# Patient Record
Sex: Female | Born: 1935 | Race: White | Hispanic: No | Marital: Single | Smoking: Never smoker
Health system: Southern US, Community
[De-identification: ages and names within clinical notes are randomized; demographics above are authoritative.]

## PROBLEM LIST (undated history)

## (undated) DIAGNOSIS — G4700 Insomnia, unspecified: Secondary | ICD-10-CM

## (undated) DIAGNOSIS — R270 Ataxia, unspecified: Secondary | ICD-10-CM

## (undated) DIAGNOSIS — R739 Hyperglycemia, unspecified: Secondary | ICD-10-CM

## (undated) DIAGNOSIS — J45909 Unspecified asthma, uncomplicated: Secondary | ICD-10-CM

## (undated) DIAGNOSIS — F329 Major depressive disorder, single episode, unspecified: Secondary | ICD-10-CM

## (undated) DIAGNOSIS — IMO0001 Reserved for inherently not codable concepts without codable children: Secondary | ICD-10-CM

## (undated) DIAGNOSIS — M810 Age-related osteoporosis without current pathological fracture: Secondary | ICD-10-CM

## (undated) DIAGNOSIS — K219 Gastro-esophageal reflux disease without esophagitis: Secondary | ICD-10-CM

## (undated) DIAGNOSIS — J019 Acute sinusitis, unspecified: Secondary | ICD-10-CM

## (undated) DIAGNOSIS — F32A Depression, unspecified: Secondary | ICD-10-CM

## (undated) DIAGNOSIS — M1990 Unspecified osteoarthritis, unspecified site: Secondary | ICD-10-CM

## (undated) DIAGNOSIS — K589 Irritable bowel syndrome without diarrhea: Secondary | ICD-10-CM

## (undated) DIAGNOSIS — R32 Unspecified urinary incontinence: Secondary | ICD-10-CM

## (undated) DIAGNOSIS — J449 Chronic obstructive pulmonary disease, unspecified: Secondary | ICD-10-CM

## (undated) DIAGNOSIS — L309 Dermatitis, unspecified: Secondary | ICD-10-CM

## (undated) DIAGNOSIS — J209 Acute bronchitis, unspecified: Secondary | ICD-10-CM

## (undated) DIAGNOSIS — R079 Chest pain, unspecified: Secondary | ICD-10-CM

## (undated) DIAGNOSIS — R809 Proteinuria, unspecified: Secondary | ICD-10-CM

## (undated) DIAGNOSIS — F429 Obsessive-compulsive disorder, unspecified: Secondary | ICD-10-CM

## (undated) DIAGNOSIS — I10 Essential (primary) hypertension: Secondary | ICD-10-CM

## (undated) DIAGNOSIS — M8580 Other specified disorders of bone density and structure, unspecified site: Secondary | ICD-10-CM

## (undated) DIAGNOSIS — E7800 Pure hypercholesterolemia, unspecified: Secondary | ICD-10-CM

## (undated) DIAGNOSIS — L0390 Cellulitis, unspecified: Secondary | ICD-10-CM

## (undated) HISTORY — DX: Gastro-esophageal reflux disease without esophagitis: K21.9

## (undated) HISTORY — DX: Acute sinusitis, unspecified: J01.90

## (undated) HISTORY — DX: Reserved for inherently not codable concepts without codable children: IMO0001

## (undated) HISTORY — DX: Ataxia, unspecified: R27.0

## (undated) HISTORY — DX: Unspecified urinary incontinence: R32

## (undated) HISTORY — DX: Age-related osteoporosis without current pathological fracture: M81.0

## (undated) HISTORY — DX: Acute bronchitis, unspecified: J20.9

## (undated) HISTORY — DX: Dermatitis, unspecified: L30.9

## (undated) HISTORY — DX: Obsessive-compulsive disorder, unspecified: F42.9

## (undated) HISTORY — DX: Insomnia, unspecified: G47.00

## (undated) HISTORY — DX: Irritable bowel syndrome, unspecified: K58.9

## (undated) HISTORY — DX: Proteinuria, unspecified: R80.9

## (undated) HISTORY — PX: HERNIA REPAIR: SHX51

## (undated) HISTORY — DX: Chest pain, unspecified: R07.9

## (undated) HISTORY — DX: Essential (primary) hypertension: I10

## (undated) HISTORY — DX: Unspecified asthma, uncomplicated: J45.909

## (undated) HISTORY — DX: Chronic obstructive pulmonary disease, unspecified: J44.9

## (undated) HISTORY — PX: CATARACT EXTRACTION: SUR2

## (undated) HISTORY — DX: Depression, unspecified: F32.A

## (undated) HISTORY — DX: Unspecified osteoarthritis, unspecified site: M19.90

## (undated) HISTORY — DX: Major depressive disorder, single episode, unspecified: F32.9

## (undated) HISTORY — DX: Cellulitis, unspecified: L03.90

## (undated) HISTORY — PX: BLADDER SURGERY: SHX569

## (undated) HISTORY — DX: Other specified disorders of bone density and structure, unspecified site: M85.80

## (undated) HISTORY — DX: Pure hypercholesterolemia, unspecified: E78.00

## (undated) HISTORY — DX: Hyperglycemia, unspecified: R73.9

## (undated) HISTORY — PX: CATARACT EXTRACTION W/PHACO: SHX586

---

## 2012-02-20 LAB — PULMONARY FUNCTION TEST

## 2012-05-28 LAB — PULMONARY FUNCTION TEST

## 2012-07-23 ENCOUNTER — Encounter: Payer: Self-pay | Admitting: Pulmonary Disease

## 2012-07-24 ENCOUNTER — Ambulatory Visit (INDEPENDENT_AMBULATORY_CARE_PROVIDER_SITE_OTHER): Payer: Medicaid Other | Admitting: Pulmonary Disease

## 2012-07-24 ENCOUNTER — Encounter: Payer: Self-pay | Admitting: Pulmonary Disease

## 2012-07-24 VITALS — BP 114/58 | HR 64 | Temp 97.5°F | Ht 61.5 in | Wt 139.0 lb

## 2012-07-24 DIAGNOSIS — R05 Cough: Secondary | ICD-10-CM

## 2012-07-24 DIAGNOSIS — R059 Cough, unspecified: Secondary | ICD-10-CM

## 2012-07-24 DIAGNOSIS — J449 Chronic obstructive pulmonary disease, unspecified: Secondary | ICD-10-CM

## 2012-07-24 NOTE — Progress Notes (Signed)
Subjective:    Patient ID: Alexandria Morris, female    DOB: July 11, 1936, 76 y.o.   MRN: [id number]  HPI 76 y.o never smoker presents for second opinion on 'copd.' She was initially told that she had asthma, then on another evaluation in [place], [place], she was told that she had 'COPD' & after coming back to [facility], Dr Tomasita Morrow did breathing tests & CXRs told her that she has COPD. She is confused about her diagnosis & wants a second opinion. She reports cough every now and then. Denies sob, chest pain, chest tightness, and wheezing.  She walks about a mile daily which includes an incline. She used to have attacks of bronchitis which have now resolved. She denies childhood history of asthma.She has a deviated nasal septum. Spirometry showed FEV1 of 0.57 (31%) & FVC of 1.26 (51%) c/w severe airway obstruction. She is on Seroquel , klonopin and Effexor from [facility]. Reflux is controlled on omeprazole. Med review shows ARB    Past Medical History  Diagnosis Date  . Acute bronchitis   . Acute sinusitis   . Cellulitis   . Chest pain   . Depression   . Hypercholesteremia   . Hyperglycemia   . Hypertension   . Microalbuminuria   . Osteoporosis   . Asthma   . Ataxia   . COPD (chronic obstructive pulmonary disease)   . Dermatitis   . Reflux   . HTN (hypertension)   . Insomnia   . IBS (irritable bowel syndrome)   . OCD (obsessive compulsive disorder)   . Osteoarthritis   . Osteopenia   . Incontinence of urine     Past Surgical History  Procedure Date  . Bladder surgery   . Cataract extraction   . Hernia repair     Allergies  Allergen Reactions  . Albuterol   . Bentyl (Dicyclomine Hcl)   . Claritin (Loratadine)   . Dicyclomine   . Haldol Decanoate (Haloperidol Decanoate)   . Lovaza (Omega-3-Acid Ethyl Esters)   . Pravastatin   . Simvastatin   . Triavil (Perphenazine-Amitriptyline)   . Zyrtec (Cetirizine Hcl)     History   Social History  . Marital  Status: Single    Spouse Name: N/A    Number of Children: 1  . Years of Education: N/A   Occupational History  . retired     Engineer, manufacturing systems, Engineer, structural, [employer]   Social History Main Topics  . Smoking status: Never Smoker   . Smokeless tobacco: Never Used  . Alcohol Use: No  . Drug Use: No  . Sexually Active: Not on file   Other Topics Concern  . Not on file   Social History Narrative  . No narrative on file    Review of Systems  Constitutional: Positive for appetite change. Negative for fever and unexpected weight change.  HENT: Positive for congestion, rhinorrhea, sneezing and postnasal drip. Negative for ear pain, nosebleeds, sore throat, trouble swallowing, dental problem and sinus pressure.   Eyes: Negative for redness and itching.  Respiratory: Positive for cough. Negative for chest tightness, shortness of breath and wheezing.   Cardiovascular: Negative for chest pain, palpitations and leg swelling.  Gastrointestinal: Positive for vomiting. Negative for nausea.  Genitourinary: Negative for dysuria.  Musculoskeletal: Negative for joint swelling.  Skin: Negative for rash.  Neurological: Negative for headaches.  Hematological: Does not bruise/bleed easily.  Psychiatric/Behavioral: Negative for dysphoric mood. The patient is nervous/anxious.        Objective:  Physical Exam  Gen. Pleasant, well-nourished, in no distress, normal affect ENT - no lesions, no post nasal drip Neck: No JVD, no thyromegaly, no carotid bruits Lungs: no use of accessory muscles, no dullness to percussion, clear without rales or rhonchi  Cardiovascular: Rhythm regular, heart sounds  normal, no murmurs or gallops, no peripheral edema Abdomen: soft and non-tender, no hepatosplenomegaly, BS normal. Musculoskeletal: No deformities, no cyanosis or clubbing Neuro:  alert, non focal        Assessment & Plan:

## 2012-07-24 NOTE — Patient Instructions (Signed)
Obtain records (PFTs, XRays from [facility] - dr Tomasita Morrow Breathing test does show airway obstruction Use symbicort if you are short of breath

## 2012-07-24 NOTE — Assessment & Plan Note (Signed)
Obstruction appears to be chronic. I could not test for reversibility today. She does not have childhood history of asthma. This may very well represent fixed obstruction. There did not appear to be any triggers in an environment or on her medications and history does not suggest asthma. We will treat her as COPD for now although in the absence of smoking this does not represent classic COPD. She can stay on Symbicort. We discussed signs of symptoms of an exacerbation such as green phlegm, wheezing and increased shortness of breath and she will contact us immediately if this should arise. We will obtain her records from Dr. Tomasita Morrow in the meantime. If this has not been done, we'll perform post bronchodilator testing. She does not appear to be very symptomatic, otherwise pulmonary rehabilitation can also be an option.

## 2012-07-27 ENCOUNTER — Telehealth: Payer: Self-pay | Admitting: Pulmonary Disease

## 2012-07-27 NOTE — Telephone Encounter (Signed)
Reviewed PFTs from [facility] >> last 6/13 severe airway obstruction, POS BD response. Stay on symbicort 2 puffs bid

## 2012-07-27 NOTE — Telephone Encounter (Signed)
lmomtcb x1 for pt 

## 2012-07-30 NOTE — Telephone Encounter (Signed)
I spoke with patient about results and she verbalized understanding and had no questions 

## 2012-07-30 NOTE — Telephone Encounter (Signed)
Pt return call. Please cb at [phone]

## 2012-08-21 ENCOUNTER — Ambulatory Visit (INDEPENDENT_AMBULATORY_CARE_PROVIDER_SITE_OTHER): Payer: 59 | Admitting: Pulmonary Disease

## 2012-08-21 ENCOUNTER — Telehealth: Payer: Self-pay | Admitting: Pulmonary Disease

## 2012-08-21 ENCOUNTER — Ambulatory Visit (HOSPITAL_BASED_OUTPATIENT_CLINIC_OR_DEPARTMENT_OTHER)
Admission: RE | Admit: 2012-08-21 | Discharge: 2012-08-21 | Disposition: A | Discharge status: Home / Self Care | Payer: PRIVATE HEALTH INSURANCE | Source: Ambulatory Visit | Attending: Pulmonary Disease | Admitting: Pulmonary Disease

## 2012-08-21 ENCOUNTER — Encounter: Payer: Self-pay | Admitting: Pulmonary Disease

## 2012-08-21 VITALS — BP 128/66 | HR 74 | Temp 97.5°F | Ht 61.5 in | Wt 144.5 lb

## 2012-08-21 DIAGNOSIS — J449 Chronic obstructive pulmonary disease, unspecified: Secondary | ICD-10-CM

## 2012-08-21 DIAGNOSIS — R911 Solitary pulmonary nodule: Secondary | ICD-10-CM

## 2012-08-21 DIAGNOSIS — I517 Cardiomegaly: Secondary | ICD-10-CM | POA: Insufficient documentation

## 2012-08-21 DIAGNOSIS — J4489 Other specified chronic obstructive pulmonary disease: Secondary | ICD-10-CM

## 2012-08-21 NOTE — Progress Notes (Signed)
  Subjective:    Patient ID: Alexandria Morris, female    DOB: [birth year], 76 y.o.   MRN: [id number]  HPI  75 y.o never smoker presents for second opinion on 'copd.'  She was initially told that she had asthma, then on another evaluation in [place], [place], she was told that she had 'COPD' & after coming back to [facility], Dr Tomasita Morrow did breathing tests & CXRs told her that she has COPD. She is confused about her diagnosis & wants a second opinion.  She reports cough every now and then. Denies sob, chest pain, chest tightness, and wheezing.  She walks about a mile daily which includes an incline. She used to have attacks of bronchitis which have now resolved. She denies childhood history of asthma.She has a deviated nasal septum.  Spirometry showed FEV1 of 0.57 (31%) & FVC of 1.26 (51%) c/w severe airway obstruction.  She is on Seroquel , klonopin and Effexor [facility] for OCD & depression Reflux is controlled on omeprazole.  Med review shows ARB Reviewed PFTs from [facility] >> last 6/13 severe airway obstruction, POS BD response.    08/21/2012 Rented a House x 27 y - had mold, now moveed out x 2 y Father smoked CAT score 4 - denies dyspnea or cough Last chest cold was 5/13 - infrequent , maybe once/2y, vaccines uptodate  Review of Systems neg for any significant sore throat, dysphagia, itching, sneezing, nasal congestion or excess/ purulent secretions, fever, chills, sweats, unintended wt loss, pleuritic or exertional cp, hempoptysis, orthopnea pnd or change in chronic leg swelling. Also denies presyncope, palpitations, heartburn, abdominal pain, nausea, vomiting, diarrhea or change in bowel or urinary habits, dysuria,hematuria, rash, arthralgias, visual complaints, headache, numbness weakness or ataxia.     Objective:   Physical Exam  Gen. Pleasant, well-nourished, in no distress ENT - no lesions, no post nasal drip Neck: No JVD, no thyromegaly, no carotid bruits Lungs: no  use of accessory muscles, no dullness to percussion, clear without rales or rhonchi  Cardiovascular: Rhythm regular, heart sounds  normal, no murmurs or gallops, no peripheral edema Musculoskeletal: No deformities, no cyanosis or clubbing        Assessment & Plan:

## 2012-08-21 NOTE — Telephone Encounter (Signed)
Notes Recorded by Oretha Milch, MD on 08/21/2012 at 4:24 PM Small nodule noted on CXR - need CT scan of chest - no contrast for further clarification OK to order if pt willing      I spoke with patient about results and she verbalized understanding and had no questions. She okay to have ct chest. i have placed orded and aware she will be contacted once this is set up. Nothing further was needed

## 2012-08-21 NOTE — Patient Instructions (Addendum)
Chest xray today You have obstruction in your airways Take symbicort 2 puffs daily Call us if you ever get a chest cold Take flu shot when available

## 2012-08-21 NOTE — Assessment & Plan Note (Signed)
Never smoker, severe airway obstruction ? Fixed asthma  Chest xray today You have obstruction in your airways Take symbicort 2 puffs daily Call us if you ever get a chest cold Take flu shot when available May benefit from pulm rehab if symptoms get worse

## 2012-08-24 ENCOUNTER — Telehealth: Payer: Self-pay | Admitting: Pulmonary Disease

## 2012-08-24 ENCOUNTER — Ambulatory Visit (INDEPENDENT_AMBULATORY_CARE_PROVIDER_SITE_OTHER)
Admission: RE | Admit: 2012-08-24 | Discharge: 2012-08-24 | Disposition: A | Discharge status: Home / Self Care | Payer: 59 | Source: Ambulatory Visit | Attending: Pulmonary Disease | Admitting: Pulmonary Disease

## 2012-08-24 DIAGNOSIS — R911 Solitary pulmonary nodule: Secondary | ICD-10-CM

## 2012-08-24 NOTE — Telephone Encounter (Signed)
Called, spoke with pt.  Requesting CT resultsfrom CT done today.  Dr. Vassie Loll, pls advise.  Thank you.

## 2012-08-24 NOTE — Telephone Encounter (Signed)
Small bilateral pulmonary nodules .A repeat noncontrast chest CT in 6 months is suggested.  Moderate sized hiatal hernia.  Common bile duct dilatation  - suggest chk blood work (BMET, LFTs) with [place] or with PCP

## 2012-08-24 NOTE — Telephone Encounter (Signed)
Called and spoke with pt and she is aware of lab results per RA.  Pt will think about when she wants to have her labs checked.  She has appt with her PCP in October and is not sure that she wants to wait that long.  Pt will call back once she decides.  Will sign off of this message for now.

## 2012-08-27 NOTE — Telephone Encounter (Signed)
RA, please advise ct results, thanks!!

## 2012-08-27 NOTE — Telephone Encounter (Signed)
Pl note earlier phone note

## 2012-08-28 ENCOUNTER — Telehealth: Payer: Self-pay | Admitting: Pulmonary Disease

## 2012-08-28 NOTE — Telephone Encounter (Signed)
I spoke with pt and advised I will send her CT results over to Dr. Willa Rough. I have just done so. Nothing further was needed

## 2012-08-29 ENCOUNTER — Telehealth: Payer: Self-pay | Admitting: Pulmonary Disease

## 2012-08-29 NOTE — Telephone Encounter (Signed)
Pt has already been giving results. See phone 08/24/12.

## 2012-08-29 NOTE — Telephone Encounter (Signed)
Pt aware that Ct has been sent to Dr Willa Rough again and if any troubles with fax to let us know.

## 2012-08-29 NOTE — Telephone Encounter (Signed)
Mindy, please have RA review and advise on CT prior to sending to Dr Willa Rough. This way the patient knows results as well.

## 2012-08-30 ENCOUNTER — Telehealth: Payer: Self-pay | Admitting: Pulmonary Disease

## 2012-08-30 NOTE — Telephone Encounter (Signed)
Pl look at earlier phone notes This has been done already

## 2012-08-30 NOTE — Telephone Encounter (Signed)
RA, please advise on ct chest results thanks

## 2012-08-30 NOTE — Telephone Encounter (Signed)
Spoke with pt and notified of results per RA. She verbalized understanding and states nothing further needed.

## 2012-09-20 ENCOUNTER — Telehealth: Payer: Self-pay | Admitting: Pulmonary Disease

## 2012-09-20 ENCOUNTER — Encounter: Payer: Self-pay | Admitting: *Deleted

## 2012-09-20 NOTE — Telephone Encounter (Signed)
Alexandria Morris, have you seen any recs on this pt? Pls advise.

## 2012-09-21 NOTE — Telephone Encounter (Signed)
Called # provided above - went directly to VM - lmomtcb 

## 2012-09-21 NOTE — Telephone Encounter (Signed)
Pt states she signed a release when here for OV on 08/21/12 and wants this refaxed since we have not received records. She will also call Dr. Orma Flaming office to see if they have sent them. Will forward back to Mindy so she can refax the release.

## 2012-09-21 NOTE — Telephone Encounter (Signed)
Patient returning call.

## 2012-09-21 NOTE — Telephone Encounter (Signed)
Dr. Vassie Loll records are in pt chart under the media tab. Pt wants to know if RA has everything he needed from [facility]. Please advise Dr. Vassie Loll thanks

## 2012-09-21 NOTE — Telephone Encounter (Signed)
Nope have not seen anything on this pt and looked in RA look at. thanks

## 2012-10-04 NOTE — Telephone Encounter (Signed)
Pt is aware of this and needed nothing further 

## 2012-10-04 NOTE — Telephone Encounter (Signed)
Has been reviewed before sending to scan - nothing more needed

## 2012-10-29 ENCOUNTER — Telehealth: Payer: Self-pay | Admitting: Pulmonary Disease

## 2012-10-29 MED ORDER — NYSTATIN 100000 UNIT/ML MT SUSP: 500000.0000 [IU] | Freq: Two times a day (BID) | OROMUCOSAL | Status: DC

## 2012-10-29 NOTE — Telephone Encounter (Signed)
Pt is aware of Ra recs. Nystatin sent to her pharmacy. Pt wishes to remain on the Symbicort for now and will call if she changes her mind and wants to try the Advair.

## 2012-10-29 NOTE — Telephone Encounter (Signed)
Called, spoke with pt.  Believes symbicort is causing thrush.  Reports she has white patches on tongue, her food doesn't taste right, and has an "awful taste in my mouth."  Denies trouble swallowing.  States she is rinsing mouth out with water really well after using symbicort.  Advised she could try a baking soda based toothpaste after using inhaler.  She verbalized understanding and would like to know: 1.  She has nystatin mouthwash -- could she use this to clear the thrush up or does she need something else. 2.  Is there another inhaler she could use that would work for her but not cause thrush?    Dr. Vassie Loll, pls advise.  Thank you.  Allergies  Allergen Reactions  . Albuterol   . Bentyl (Dicyclomine Hcl)   . Claritin (Loratadine)   . Dicyclomine   . Haldol Decanoate (Haloperidol Decanoate)   . Lovaza (Omega-3-Acid Ethyl Esters)   . Pravastatin   . Simvastatin   . Triavil (Perphenazine-Amitriptyline)   . Zyrtec (Cetirizine Hcl)

## 2012-10-29 NOTE — Telephone Encounter (Signed)
Nystatin ok - 5ml po bid swish & swallow Alternative - any steroid inhaler will have thrush as side effect, can try advair 100/50 bid - dry powder , maybe will have less thrush - rinse mouth after use

## 2012-12-16 IMAGING — CR DG CHEST 2V
2 series · 2 of 2 positions shown · non-contrast
Comparison: None.

CLINICAL DATA: COPD

CHEST - 2 VIEW

[w chest pa]
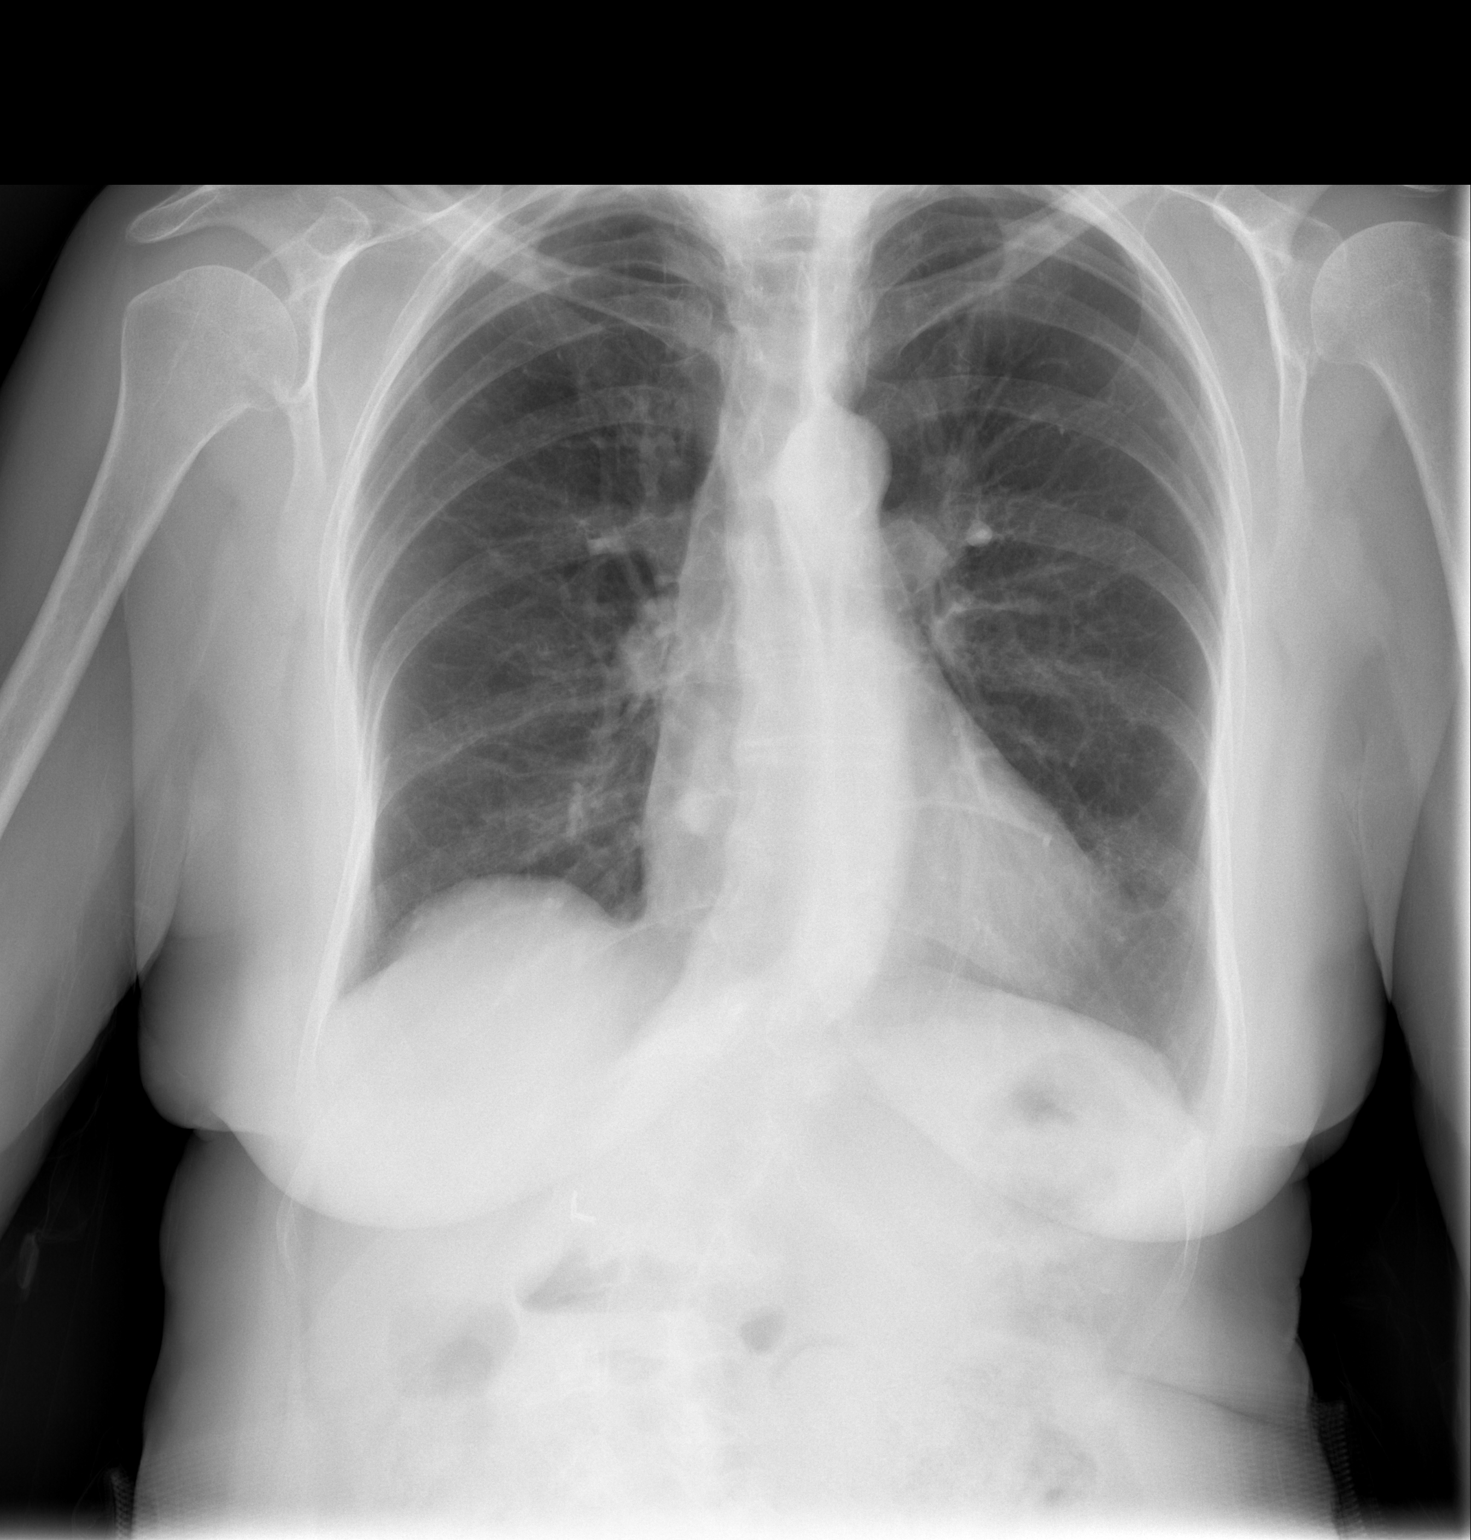

[w chest lat]
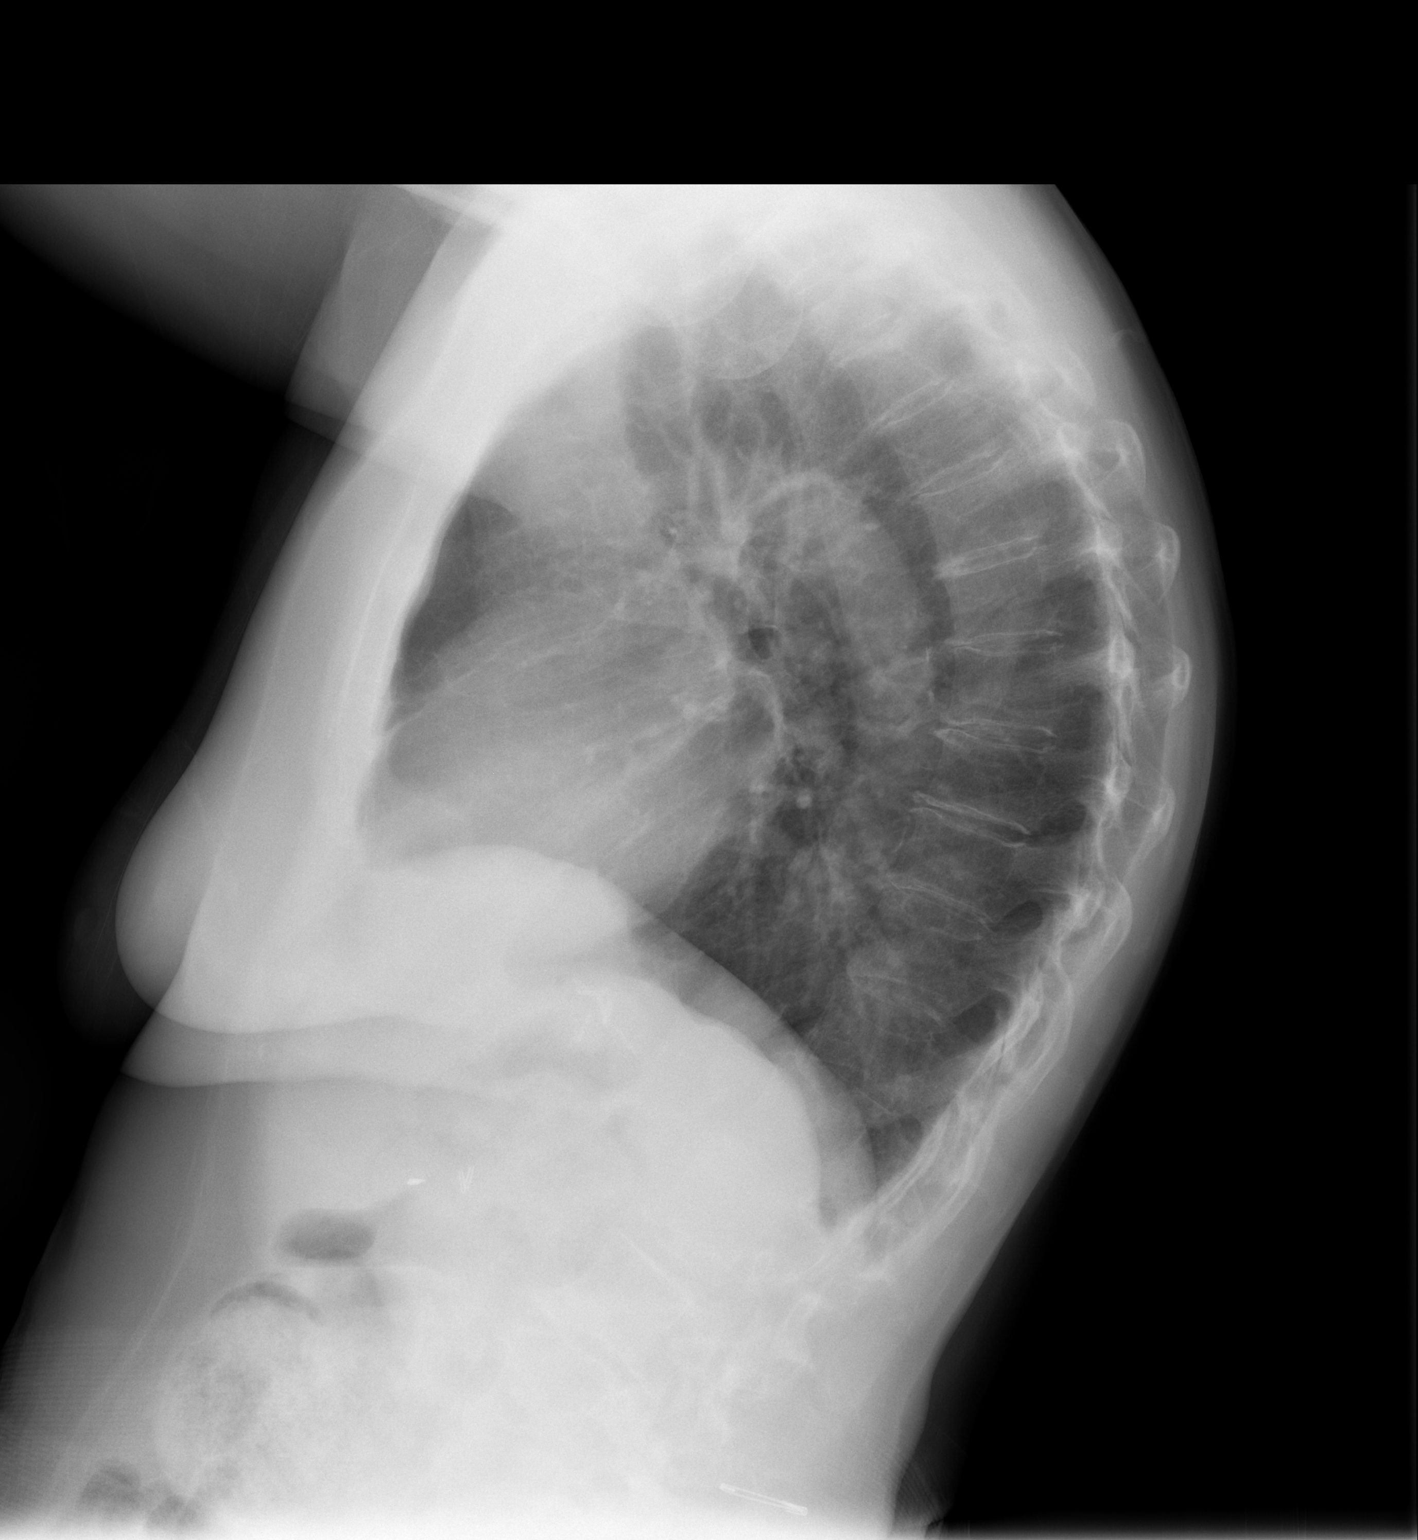

[2 of 2 positions shown; findings below may reference images not displayed]

FINDINGS: Mild cardiomegaly.  Hyperaeration with increased AP
diameter of the chest. Focal nodular density projects over the
right atrium on the PA view only.  No pleural effusion.  No
pneumothorax.
IMPRESSION: Right basilar nodular density.  Pulmonary nodule is not excluded.
Comparison with prior studies would be helpful.  If none are
available, CT is recommended.

## 2013-01-15 ENCOUNTER — Encounter: Payer: Self-pay | Admitting: Adult Health

## 2013-01-15 ENCOUNTER — Ambulatory Visit (INDEPENDENT_AMBULATORY_CARE_PROVIDER_SITE_OTHER): Payer: PRIVATE HEALTH INSURANCE | Admitting: Adult Health

## 2013-01-15 VITALS — BP 102/72 | HR 70 | Temp 97.6°F | Ht 61.5 in | Wt 151.0 lb

## 2013-01-15 DIAGNOSIS — J449 Chronic obstructive pulmonary disease, unspecified: Secondary | ICD-10-CM

## 2013-01-15 DIAGNOSIS — R918 Other nonspecific abnormal finding of lung field: Secondary | ICD-10-CM

## 2013-01-15 NOTE — Addendum Note (Signed)
Addended by: Darrell Jewel on: 01/15/2013 10:33 AM   Modules accepted: Orders

## 2013-01-15 NOTE — Patient Instructions (Signed)
Continue on Symbicort 2 puffs Twice daily  -brush/rinse/gargle after use.  May Use ProAir HFA 2 puffs every 6 hr As needed  Wheezing--THIS IS YOUR RESCUE/EMERGENCY INHALER We are setting you up for a CT chest in March to follow very small lung nodules.  Check with your Family Doctor when your last Pneumonia vaccine was??  Follow up Dr. Vassie Loll  In 4 months and As needed

## 2013-01-15 NOTE — Assessment & Plan Note (Signed)
Chronic obstructive asthma compensated on present regimen No recent flares.  Cont on current regimen follow up with Dr. Vassie Loll  In 4 months  Check on Pneumovax  w/ PCP

## 2013-01-15 NOTE — Progress Notes (Signed)
Subjective:    Patient ID: Alexandria Morris, female    DOB: 09/25/1936, 77 y.o.   MRN: [id number]  HPI 77 y.o never smoker presents for second opinion on 'copd.'  She was initially told that she had asthma, then on another evaluation in [place], [place], she was told that she had 'COPD' & after coming back to [facility], Dr Tomasita Morrow did breathing tests & CXRs told her that she has COPD. She is confused about her diagnosis & wants a second opinion.  She reports cough every now and then. Denies sob, chest pain, chest tightness, and wheezing.  She walks about a mile daily which includes an incline. She used to have attacks of bronchitis which have now resolved. She denies childhood history of asthma.She has a deviated nasal septum.  Spirometry showed FEV1 of 0.57 (31%) & FVC of 1.26 (51%) c/w severe airway obstruction.  She is on Seroquel , klonopin and Effexor from [facility] for OCD & depression Reflux is controlled on omeprazole.  Med review shows ARB Reviewed PFTs from [facility] >> last 6/13 severe airway obstruction, POS BD response.    08/21/12 Rented a House x 27 y - had mold, now moveed out x 2 y Father smoked CAT score 4 - denies dyspnea or cough Last chest cold was 5/13 - infrequent , maybe once/2y, vaccines uptodate > CXR w/ small nodule , CT chest w/ Small bilateral pulmonary nodules Will need repeat 6 months   01/15/2013 Follow up  4 month asthma follow-up. Pt denies any new complaints at this time.  Says she has been doing well with no flare in cough, wheezing or dyspnea.  No SABA use. No ER or Hospitalizations since last ov.  Use Symbicort 2 puffs Twice daily  .  Last ov CXR w/ small nodule, subsequent CT chest showed small bilateral pulmonary nodules.  Plan to repeat 6 month ~02/2013  Also noted common  Bile duct dilatation w/ recs to check w/ PCP for labs w/ LFT. She says this was nml. No records available .    Review of Systems  Constitutional:   No  weight loss,  night sweats,  Fevers, chills, fatigue, or  lassitude.  HEENT:   No headaches,  Difficulty swallowing,  Tooth/dental problems, or  Sore throat,                No sneezing, itching, ear ache, nasal congestion, post nasal drip,   CV:  No chest pain,  Orthopnea, PND, swelling in lower extremities, anasarca, dizziness, palpitations, syncope.   GI  No heartburn, indigestion, abdominal pain, nausea, vomiting, diarrhea, change in bowel habits, loss of appetite, bloody stools.   Resp:    No excess mucus, no productive cough,  No non-productive cough,  No coughing up of blood.  No change in color of mucus.  No wheezing.  No chest wall deformity  Skin: no rash or lesions.  GU: no dysuria, change in color of urine, no urgency or frequency.  No flank pain, no hematuria   MS:  No joint pain or swelling.  No decreased range of motion.  No back pain.  Psych:    No memory loss.         Objective:   Physical Exam   Gen. Pleasant, well-nourished, in no distress ENT - no lesions, no post nasal drip, dentures  Neck: No JVD, no thyromegaly, no carotid bruits Lungs: no use of accessory muscles, no dullness to percussion, clear without rales or rhonchi  Cardiovascular: Rhythm regular, heart sounds  normal, no murmurs or gallops, no peripheral edema Musculoskeletal: No deformities, no cyanosis or clubbing        Assessment & Plan:

## 2013-01-15 NOTE — Assessment & Plan Note (Signed)
Small nodules seen on CT 08/2012 -low risk in never smoker  Repeat CT due in 02/2013 >

## 2013-02-18 ENCOUNTER — Other Ambulatory Visit (HOSPITAL_BASED_OUTPATIENT_CLINIC_OR_DEPARTMENT_OTHER): Payer: PRIVATE HEALTH INSURANCE

## 2013-02-19 ENCOUNTER — Ambulatory Visit (HOSPITAL_BASED_OUTPATIENT_CLINIC_OR_DEPARTMENT_OTHER): Payer: PRIVATE HEALTH INSURANCE

## 2013-02-20 ENCOUNTER — Other Ambulatory Visit (HOSPITAL_BASED_OUTPATIENT_CLINIC_OR_DEPARTMENT_OTHER): Payer: PRIVATE HEALTH INSURANCE

## 2013-02-26 ENCOUNTER — Encounter: Payer: Self-pay | Admitting: Adult Health

## 2013-02-26 ENCOUNTER — Ambulatory Visit (HOSPITAL_BASED_OUTPATIENT_CLINIC_OR_DEPARTMENT_OTHER)
Admission: RE | Admit: 2013-02-26 | Discharge: 2013-02-26 | Disposition: A | Discharge status: Home / Self Care | Payer: PRIVATE HEALTH INSURANCE | Source: Ambulatory Visit | Attending: Adult Health | Admitting: Adult Health

## 2013-02-26 DIAGNOSIS — R918 Other nonspecific abnormal finding of lung field: Secondary | ICD-10-CM | POA: Insufficient documentation

## 2013-03-01 ENCOUNTER — Other Ambulatory Visit: Payer: Self-pay | Admitting: Adult Health

## 2013-03-01 DIAGNOSIS — R918 Other nonspecific abnormal finding of lung field: Secondary | ICD-10-CM

## 2013-03-01 NOTE — Progress Notes (Signed)
Result Notes    Notes Recorded by Sherre Lain, MA on 03/01/2013 at 2:13 PM Orders only encounter created for CT order to be done in 1 year. ------  Notes Recorded by Julio Sicks, NP on 02/26/2013 at 11:17 AM No change in nodules  Repeat in CT in 1 year  Send to [facility] to have this arranged to follow pulmonary nodules  Pt aware of results

## 2013-03-01 NOTE — Progress Notes (Signed)
Quick Note:  Orders only encounter created for CT order to be done in 1 year. ______

## 2013-03-26 ENCOUNTER — Telehealth: Payer: Self-pay | Admitting: Pulmonary Disease

## 2013-03-26 MED ORDER — DOXYCYCLINE HYCLATE 100 MG PO TABS: 100.0000 mg | ORAL_TABLET | Freq: Every day | ORAL | Status: DC

## 2013-03-26 NOTE — Telephone Encounter (Signed)
Pt returned triage's call.  Holly D Pryor ° °

## 2013-03-26 NOTE — Telephone Encounter (Signed)
Doxycycline 100 daily x 7ds

## 2013-03-26 NOTE — Telephone Encounter (Signed)
Spoke with pt and notified of recs per RA She states that she can not take zithromax per her PCP She states no allergy to this, but her PCP forbid her to ever take this med I called Dr Willa Rough' office and nurse there stated that she does not see this listed in the chart, but would have to ask Dr Willa Rough and she is not there today I advised will just see if we can call in something else RA, please advise alternative, thanks

## 2013-03-26 NOTE — Telephone Encounter (Signed)
Spoke with pt She is c/o increased cough x 2 wks She states that at first it was prod with green sputum, but now sputum is thick and clear  She is taking otc tussin syrup with not much relief She is asking for something to be called in She declines any increased SOB, wheeze, CP, f/c/s Declined ov Please advise thanks! Allergies  Allergen Reactions  . Albuterol   . Bentyl (Dicyclomine Hcl)   . Claritin (Loratadine)   . Dicyclomine   . Haldol Decanoate (Haloperidol Decanoate)   . Lovaza (Omega-3-Acid Ethyl Esters)   . Pravastatin   . Simvastatin   . Triavil (Perphenazine-Amitriptyline)   . Zyrtec (Cetirizine Hcl)

## 2013-03-26 NOTE — Telephone Encounter (Signed)
I spoke with pt and is aware of RA recs. rx has been called in.

## 2013-03-26 NOTE — Telephone Encounter (Signed)
zpak mucinex DM 5ml po bid x 1 wk

## 2013-03-26 NOTE — Telephone Encounter (Signed)
lmomtcb x1 

## 2013-05-06 ENCOUNTER — Encounter (HOSPITAL_BASED_OUTPATIENT_CLINIC_OR_DEPARTMENT_OTHER): Payer: Self-pay | Admitting: *Deleted

## 2013-05-06 ENCOUNTER — Emergency Department (HOSPITAL_BASED_OUTPATIENT_CLINIC_OR_DEPARTMENT_OTHER): Payer: PRIVATE HEALTH INSURANCE

## 2013-05-06 ENCOUNTER — Emergency Department (HOSPITAL_BASED_OUTPATIENT_CLINIC_OR_DEPARTMENT_OTHER)
Admission: EM | Admit: 2013-05-06 | Discharge: 2013-05-06 | Disposition: A | Discharge status: Home / Self Care | Payer: PRIVATE HEALTH INSURANCE | Attending: Emergency Medicine | Admitting: Emergency Medicine

## 2013-05-06 DIAGNOSIS — Z8679 Personal history of other diseases of the circulatory system: Secondary | ICD-10-CM | POA: Insufficient documentation

## 2013-05-06 DIAGNOSIS — Z8669 Personal history of other diseases of the nervous system and sense organs: Secondary | ICD-10-CM | POA: Insufficient documentation

## 2013-05-06 DIAGNOSIS — Z8719 Personal history of other diseases of the digestive system: Secondary | ICD-10-CM | POA: Insufficient documentation

## 2013-05-06 DIAGNOSIS — J449 Chronic obstructive pulmonary disease, unspecified: Secondary | ICD-10-CM | POA: Insufficient documentation

## 2013-05-06 DIAGNOSIS — K219 Gastro-esophageal reflux disease without esophagitis: Secondary | ICD-10-CM | POA: Insufficient documentation

## 2013-05-06 DIAGNOSIS — S92102A Unspecified fracture of left talus, initial encounter for closed fracture: Secondary | ICD-10-CM

## 2013-05-06 DIAGNOSIS — Z872 Personal history of diseases of the skin and subcutaneous tissue: Secondary | ICD-10-CM | POA: Insufficient documentation

## 2013-05-06 DIAGNOSIS — Z79899 Other long term (current) drug therapy: Secondary | ICD-10-CM | POA: Insufficient documentation

## 2013-05-06 DIAGNOSIS — Z87448 Personal history of other diseases of urinary system: Secondary | ICD-10-CM | POA: Insufficient documentation

## 2013-05-06 DIAGNOSIS — Z8709 Personal history of other diseases of the respiratory system: Secondary | ICD-10-CM | POA: Insufficient documentation

## 2013-05-06 DIAGNOSIS — Y9389 Activity, other specified: Secondary | ICD-10-CM | POA: Insufficient documentation

## 2013-05-06 DIAGNOSIS — W010XXA Fall on same level from slipping, tripping and stumbling without subsequent striking against object, initial encounter: Secondary | ICD-10-CM | POA: Insufficient documentation

## 2013-05-06 DIAGNOSIS — I10 Essential (primary) hypertension: Secondary | ICD-10-CM | POA: Insufficient documentation

## 2013-05-06 DIAGNOSIS — J4489 Other specified chronic obstructive pulmonary disease: Secondary | ICD-10-CM | POA: Insufficient documentation

## 2013-05-06 DIAGNOSIS — Z7982 Long term (current) use of aspirin: Secondary | ICD-10-CM | POA: Insufficient documentation

## 2013-05-06 DIAGNOSIS — E7800 Pure hypercholesterolemia, unspecified: Secondary | ICD-10-CM | POA: Insufficient documentation

## 2013-05-06 DIAGNOSIS — Z792 Long term (current) use of antibiotics: Secondary | ICD-10-CM | POA: Insufficient documentation

## 2013-05-06 DIAGNOSIS — F429 Obsessive-compulsive disorder, unspecified: Secondary | ICD-10-CM | POA: Insufficient documentation

## 2013-05-06 DIAGNOSIS — IMO0002 Reserved for concepts with insufficient information to code with codable children: Secondary | ICD-10-CM | POA: Insufficient documentation

## 2013-05-06 DIAGNOSIS — F329 Major depressive disorder, single episode, unspecified: Secondary | ICD-10-CM | POA: Insufficient documentation

## 2013-05-06 DIAGNOSIS — Z8739 Personal history of other diseases of the musculoskeletal system and connective tissue: Secondary | ICD-10-CM | POA: Insufficient documentation

## 2013-05-06 DIAGNOSIS — S92109A Unspecified fracture of unspecified talus, initial encounter for closed fracture: Secondary | ICD-10-CM | POA: Insufficient documentation

## 2013-05-06 DIAGNOSIS — F3289 Other specified depressive episodes: Secondary | ICD-10-CM | POA: Insufficient documentation

## 2013-05-06 DIAGNOSIS — Y9289 Other specified places as the place of occurrence of the external cause: Secondary | ICD-10-CM | POA: Insufficient documentation

## 2013-05-06 NOTE — ED Notes (Signed)
Pt tripped over curb and fell injuring left foot, denies any LOC. Pt has swelling to left ankle and abrasions to knees.

## 2013-05-06 NOTE — ED Provider Notes (Signed)
History     CSN: [id number]  Arrival date & time 05/06/13  1958   First MD Initiated Contact with Patient 05/06/13 2129      Chief Complaint  Patient presents with  . Foot Pain    (Consider location/radiation/quality/duration/timing/severity/associated sxs/prior treatment) Patient is a 77 y.o. female presenting with foot injury. The history is provided by the patient. No language interpreter was used.  Foot Injury Location:  Foot Foot location:  L foot Pain details:    Quality:  Aching   Severity:  Moderate   Timing:  Constant   Progression:  Worsening Chronicity:  New Dislocation: no   Prior injury to area:  No Relieved by:  Nothing Worsened by:  Nothing tried Ineffective treatments:  None tried Associated symptoms: no back pain   Risk factors: no concern for non-accidental trauma   Pt tripped over a curb and fell injuring foot.  Pt complains of pain to front of foot  Past Medical History  Diagnosis Date  . Acute bronchitis   . Acute sinusitis   . Cellulitis   . Chest pain   . Depression   . Hypercholesteremia   . Hyperglycemia   . Hypertension   . Microalbuminuria   . Osteoporosis   . Asthma   . Ataxia   . COPD (chronic obstructive pulmonary disease)   . Dermatitis   . Reflux   . HTN (hypertension)   . Insomnia   . IBS (irritable bowel syndrome)   . OCD (obsessive compulsive disorder)   . Osteoarthritis   . Osteopenia   . Incontinence of urine     Past Surgical History  Procedure Laterality Date  . Bladder surgery    . Cataract extraction    . Hernia repair      Family History  Problem Relation Age of Onset  . Heart disease    . Breast cancer    . Coronary artery disease    . Depression    . Diabetes    . Hyperlipidemia    . Hypertension    . Osteoporosis    . Kidney disease    . Lung disease    . Stroke      History  Substance Use Topics  . Smoking status: Never Smoker   . Smokeless tobacco: Never Used  . Alcohol Use: No     OB History   Grav Para Term Preterm Abortions TAB SAB Ect Mult Living                  Review of Systems  Musculoskeletal: Positive for joint swelling. Negative for back pain.  All other systems reviewed and are negative.    Allergies  Albuterol; Bentyl; Claritin; Dicyclomine; Haldol decanoate; Lovaza; Pravastatin; Simvastatin; and Triavil  Home Medications   Current Outpatient Rx  Name  Route  Sig  Dispense  Refill  . acetaminophen (TYLENOL) 325 MG tablet   Oral   Take 650 mg by mouth every 4 (four) hours as needed.         Marland Kitchen amLODipine (NORVASC) 5 MG tablet   Oral   Take 5 mg by mouth daily.         Marland Kitchen aspirin 81 MG tablet   Oral   Take 81 mg by mouth daily.         . brimonidine (ALPHAGAN) 0.15 % ophthalmic solution   Right Eye   Place 1 drop into the right eye 2 (two) times daily.         Marland Kitchen  budesonide-formoterol (SYMBICORT) 80-4.5 MCG/ACT inhaler   Inhalation   Inhale 2 puffs into the lungs 2 (two) times daily.         . clonazePAM (KLONOPIN) 0.5 MG tablet   Oral   Take 0.5-1 mg by mouth 2 (two) times daily as needed.         . fish oil-omega-3 fatty acids 1000 MG capsule   Oral   Take 1 g by mouth daily.         . fluticasone (FLONASE) 50 MCG/ACT nasal spray   Nasal   Place 2 sprays into the nose as needed.          . Multiple Vitamins-Minerals (CENTRUM PO)   Oral   Take 1 tablet by mouth daily.         . multivitamin-lutein (OCUVITE-LUTEIN) CAPS   Oral   Take 1 capsule by mouth daily.         Marland Kitchen olmesartan (BENICAR) 20 MG tablet   Oral   Take 20 mg by mouth daily.         Marland Kitchen omeprazole (PRILOSEC) 40 MG capsule   Oral   Take 40 mg by mouth daily.         . QUEtiapine (SEROQUEL) 25 MG tablet   Oral   Take 50 mg by mouth at bedtime.         . Venlafaxine HCl 37.5 MG TB24   Oral   Take 1 tablet by mouth Daily.         Marland Kitchen doxycycline (VIBRA-TABS) 100 MG tablet   Oral   Take 1 tablet (100 mg total) by mouth  daily.   7 tablet   0     BP 143/71  Pulse 84  Temp(Src) 97.8 F (36.6 C) (Oral)  Resp 16  Ht 5\' 1"  (1.549 m)  Wt 146 lb (66.225 kg)  BMI 27.6 kg/m2  SpO2 97%  Physical Exam  Nursing note and vitals reviewed. Constitutional: She is oriented to person, place, and time. She appears well-developed and well-nourished.  HENT:  Head: Normocephalic.  Musculoskeletal: She exhibits tenderness.  Tender top of left foot, swollen,  Slight swelling lateral malleolus  Neurological: She is alert and oriented to person, place, and time. She has normal reflexes.  Skin: Skin is warm.  Psychiatric: She has a normal mood and affect.    ED Course  Procedures (including critical care time)  Labs Reviewed - No data to display Dg Ankle Complete Left  05/06/2013   *RADIOLOGY REPORT*  Clinical Data: Lateral left foot and ankle pain post fall  LEFT ANKLE COMPLETE - 3+ VIEW  Comparison: None  Findings: Lateral soft tissue swelling. Ankle mortise intact. Bones diffusely demineralized. Question spurring along the dorsal margin of the distal talus versus a thin avulsion fragment. No additional fracture, dislocation, or bone destruction.  IMPRESSION: Cannot completely exclude an avulsion fracture along the dorsal margin of the distal talus at the talonavicular joint though this could be related to potentially spurring; recommend correlation for pain/tenderness at this site.   Original Report Authenticated By: Ulyses Southward, M.D.   Dg Foot Complete Left  05/06/2013   *RADIOLOGY REPORT*  Clinical Data: Lateral left foot ankle pain post fall  LEFT FOOT - COMPLETE 3+ VIEW  Comparison: None  Findings: Diffuse osseous demineralization. Cortical irregularity identified at the dorsal margin of the distal talus, could related to spurring but a small avulsion fracture is difficult to completely exclude. No additional fracture, dislocation or bone destruction.  Question anterior soft tissue swelling at ankle.  IMPRESSION:  Question spurring versus less likely an avulsion fragment at the dorsal margin of the distal talus; recommend correlation for pain/tenderness at this site.   Original Report Authenticated By: Ulyses Southward, M.D.     1. Talus fracture, left, closed, initial encounter       MDM  Pt counseled on xrays.  Pt advised to follow up with Dr. Pearletha Forge in 1 week.  Pt placed in a cam walker and given rx for a walker        Elson Areas, PA-C 05/06/13 2234

## 2013-05-07 NOTE — ED Provider Notes (Signed)
Medical screening examination/treatment/procedure(s) were performed by non-physician practitioner and as supervising physician I was immediately available for consultation/collaboration.   Carleene Cooper III, MD 05/07/13 [phone]

## 2013-05-14 ENCOUNTER — Ambulatory Visit: Payer: PRIVATE HEALTH INSURANCE | Admitting: Pulmonary Disease

## 2013-05-28 ENCOUNTER — Ambulatory Visit (INDEPENDENT_AMBULATORY_CARE_PROVIDER_SITE_OTHER): Payer: PRIVATE HEALTH INSURANCE | Admitting: Pulmonary Disease

## 2013-05-28 ENCOUNTER — Encounter: Payer: Self-pay | Admitting: Pulmonary Disease

## 2013-05-28 VITALS — BP 132/78 | HR 87 | Temp 97.4°F | Ht 61.5 in | Wt 147.1 lb

## 2013-05-28 DIAGNOSIS — J449 Chronic obstructive pulmonary disease, unspecified: Secondary | ICD-10-CM

## 2013-05-28 DIAGNOSIS — R918 Other nonspecific abnormal finding of lung field: Secondary | ICD-10-CM

## 2013-05-28 NOTE — Assessment & Plan Note (Signed)
Ct symbicort Albuterol prn only

## 2013-05-28 NOTE — Assessment & Plan Note (Signed)
Rpt  CT chest ~02/2014

## 2013-05-28 NOTE — Patient Instructions (Signed)
Stay on symbicort 2 puffs twice daily CT scan in march 2015

## 2013-05-28 NOTE — Progress Notes (Signed)
  Subjective:    Patient ID: Alexandria Morris, female    DOB: 1936/10/23, 78 y.o.   MRN: [id number]  HPI PCP - Girard Cooter 77 y.o never smoker presents for FU of 'copd.' & BL tiny pulmonary nodules, first noted 08/2012 She was initially told that she had asthma, then on another evaluation in [place], [place], she was told that she had 'COPD' & after coming back to [facility], Dr Tomasita Morrow did breathing tests & CXRs told her that she has COPD. She is confused about her diagnosis & wants a second opinion.  She reports cough every now and then. Denies sob, chest pain, chest tightness, and wheezing.  She walks about a mile daily which includes an incline. She used to have attacks of bronchitis which have now resolved. She denies childhood history of asthma.She has a deviated nasal septum.  Spirometry showed FEV1 of 0.57 (31%) & FVC of 1.26 (51%) c/w severe airway obstruction.  She is on Seroquel , klonopin and Effexor from [facility] for OCD & depression  Reflux is controlled on omeprazole.  Med review shows ARB  Reviewed [facility] >> last 6/13 severe airway obstruction, POS BD response.   Rented a House x 88 y - had mold,  moved out in 2011 Father smoked    . 05/28/2013 82m FU  Pt states her breathing has been fine. Has very little cough. Denis any wheezing, no chest tx. Pt has no complaints today Pla is to start olanzapine & taper off seroquel & effexor Says she has been doing well with no flare in cough, wheezing or dyspnea.  No SABA use. No ER or Hospitalizations since last ov.  Use Symbicort 2 puffs Twice daily .  75m FU CT chest in 02/2013 showed small bilateral pulmonary nodules.  Also noted common Bile duct dilatation  w/ LFTs nml per pt.  She had episode of pna since last visit- now resolved  Review of Systems neg for any significant sore throat, dysphagia, itching, sneezing, nasal congestion or excess/ purulent secretions, fever, chills, sweats, unintended wt loss,  pleuritic or exertional cp, hempoptysis, orthopnea pnd or change in chronic leg swelling. Also denies presyncope, palpitations, heartburn, abdominal pain, nausea, vomiting, diarrhea or change in bowel or urinary habits, dysuria,hematuria, rash, arthralgias, visual complaints, headache, numbness weakness or ataxia.     Objective:   Physical Exam  Gen. Pleasant, well-nourished, in no distress ENT - no lesions, no post nasal drip Neck: No JVD, no thyromegaly, no carotid bruits Lungs: kyphosis +, no use of accessory muscles, no dullness to percussion, decreased without rales or rhonchi  Cardiovascular: Rhythm regular, heart sounds  normal, no murmurs or gallops, no peripheral edema Musculoskeletal: No deformities, no cyanosis or clubbing        Assessment & Plan:

## 2013-06-04 ENCOUNTER — Other Ambulatory Visit: Payer: Self-pay | Admitting: *Deleted

## 2013-06-04 MED ORDER — BUDESONIDE-FORMOTEROL FUMARATE 80-4.5 MCG/ACT IN AERO: 2.0000 | INHALATION_SPRAY | Freq: Two times a day (BID) | RESPIRATORY_TRACT | Status: DC

## 2013-07-08 ENCOUNTER — Telehealth: Payer: Self-pay | Admitting: Pulmonary Disease

## 2013-07-08 NOTE — Telephone Encounter (Signed)
Spoke with patient-- Patient states she has been using the symbicort but it is now starting to leave a "terrible taste" in her mouth Patient states she has been washing her mouth out after using but this has not helped Patient states Dr. Vassie Loll mentioned a powder inhaler (i do not see this) Dr. Vassie Loll please  Advise, thank you

## 2013-07-09 NOTE — Telephone Encounter (Signed)
Can trial [facility]

## 2013-07-10 MED ORDER — FLUTICASONE FUROATE-VILANTEROL 100-25 MCG/INH IN AEPB: 1.0000 | INHALATION_SPRAY | Freq: Every day | RESPIRATORY_TRACT | Status: DC

## 2013-07-10 NOTE — Telephone Encounter (Signed)
I spoke with the pt and advised. Pt is a [facility] pt so I gave samples to Crystal totake to [facility] tomorrow. Carron Curie, CMA

## 2013-08-31 IMAGING — CR DG ANKLE COMPLETE 3+V*L*
3 series · 3 of 3 positions shown · non-contrast
Comparison: None

CLINICAL DATA: Lateral left foot and ankle pain post fall

LEFT ANKLE COMPLETE - 3+ VIEW

[t ankle joint ap left]
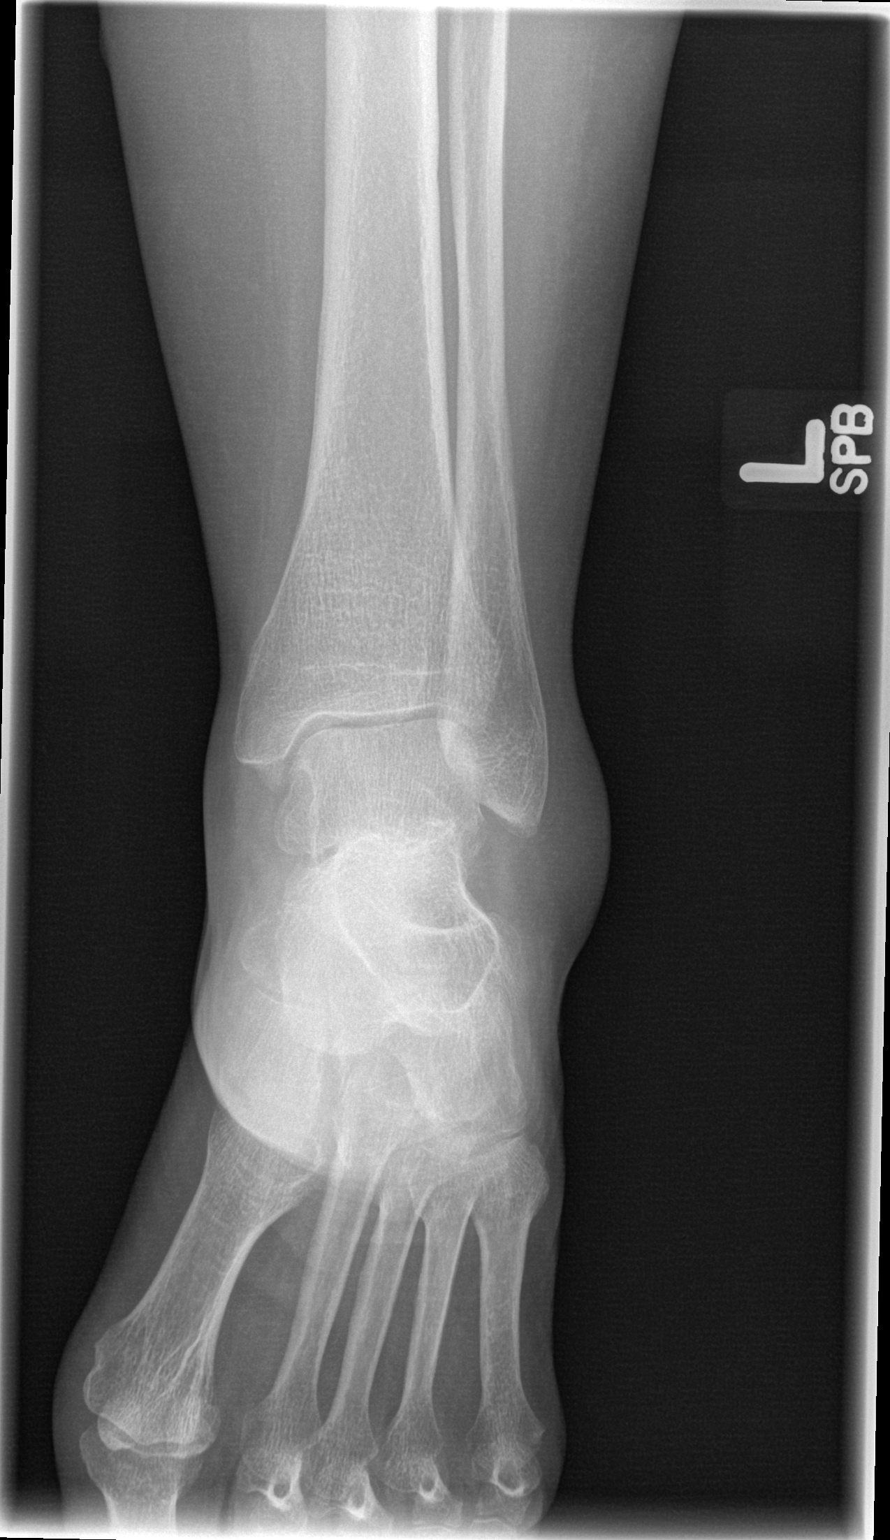

[t ankle joint oblique left]
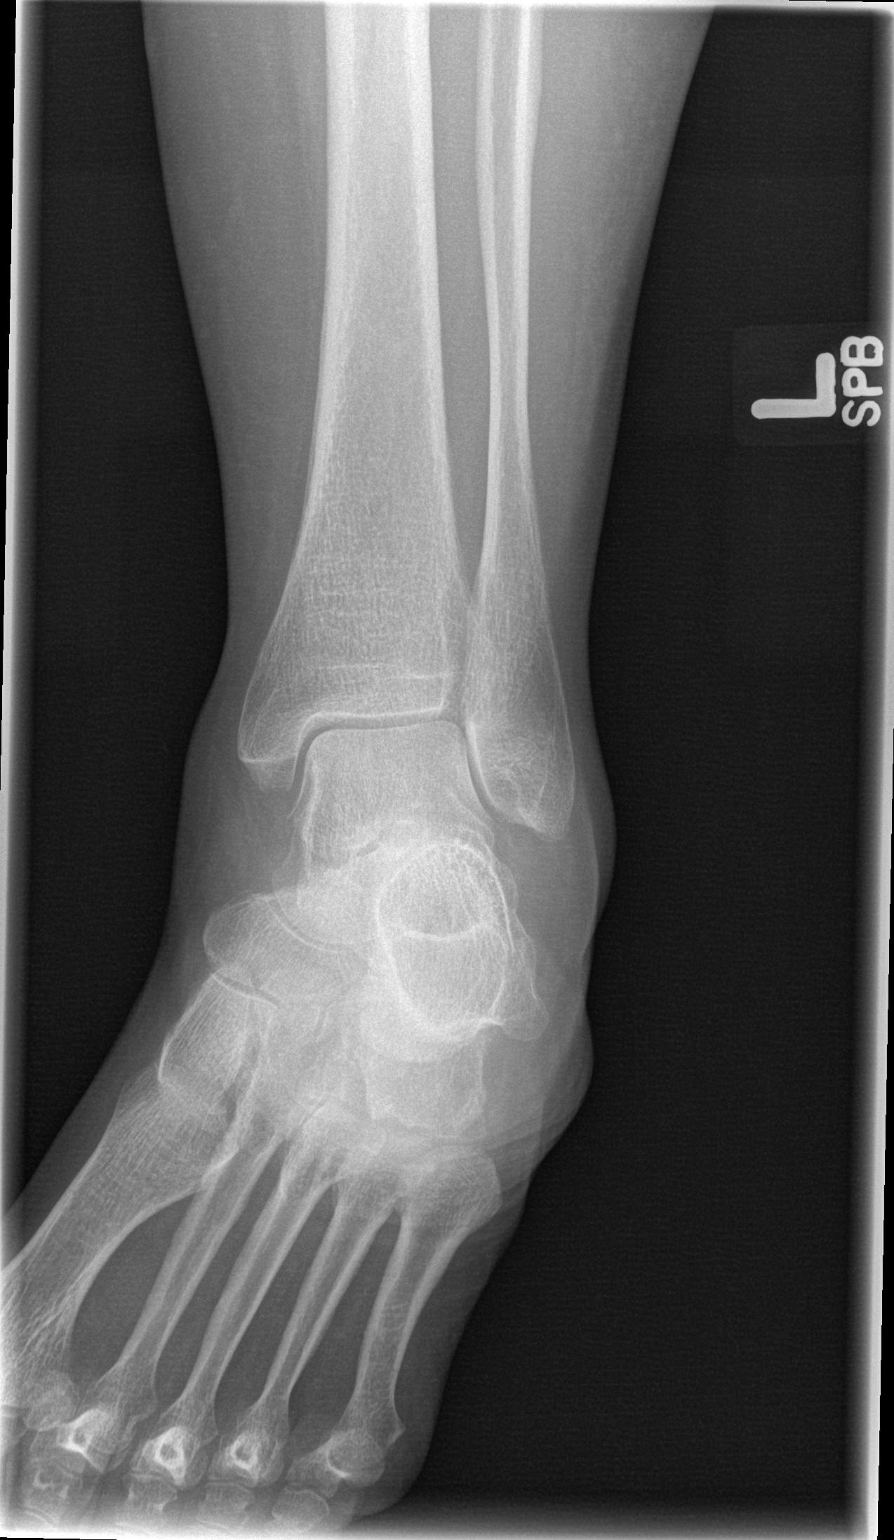

[t ankle joint lat left]
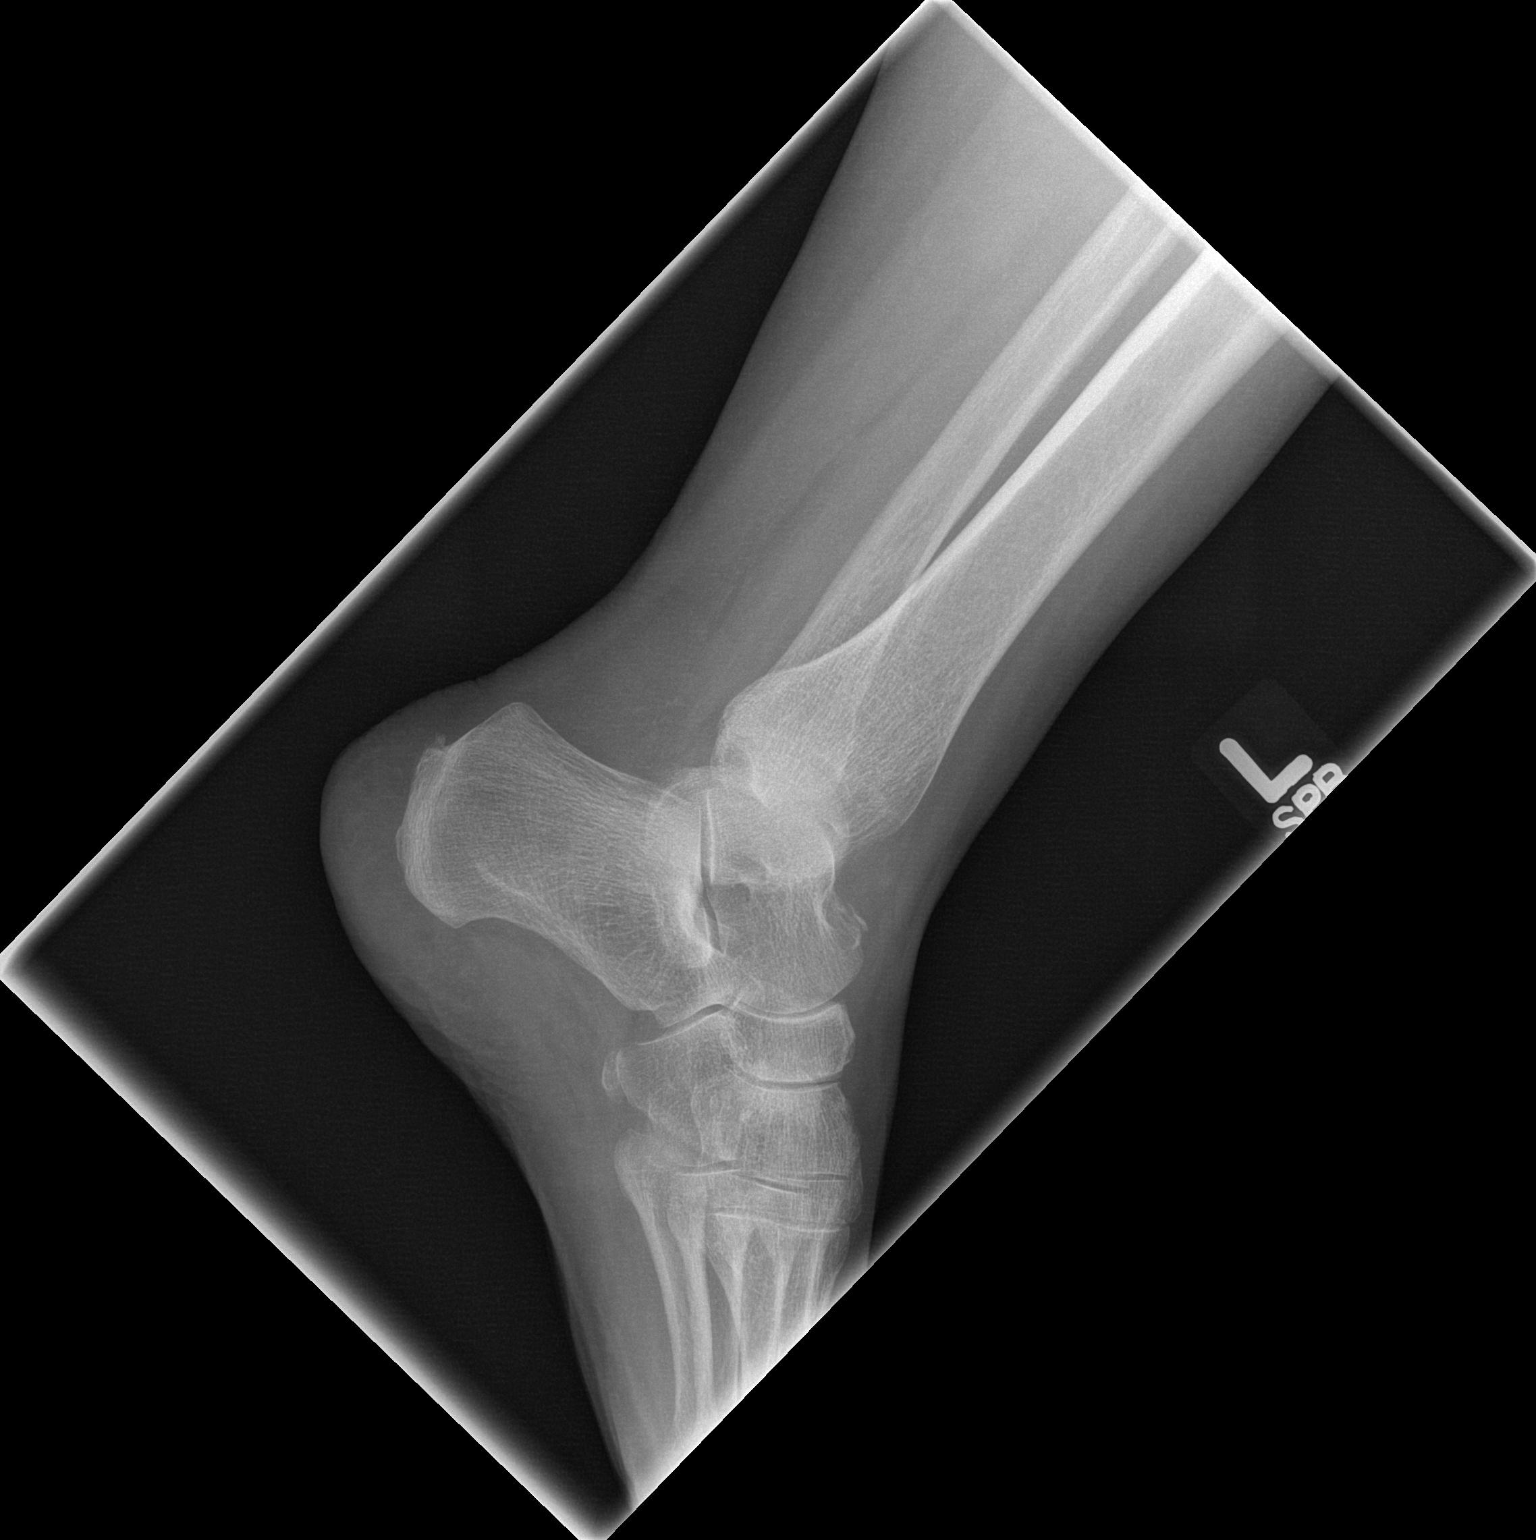

[3 of 3 positions shown; findings below may reference images not displayed]

FINDINGS: Lateral soft tissue swelling.
Ankle mortise intact.
Bones diffusely demineralized.
Question spurring along the dorsal margin of the distal talus
versus a thin avulsion fragment.
No additional fracture, dislocation, or bone destruction.
IMPRESSION: Cannot completely exclude an avulsion fracture along the dorsal
margin of the distal talus at the talonavicular joint though this
could be related to potentially spurring; recommend correlation for
pain/tenderness at this site.

## 2013-12-03 ENCOUNTER — Ambulatory Visit: Payer: PRIVATE HEALTH INSURANCE | Admitting: Adult Health

## 2013-12-30 ENCOUNTER — Ambulatory Visit: Payer: PRIVATE HEALTH INSURANCE | Admitting: Adult Health

## 2014-01-24 ENCOUNTER — Ambulatory Visit: Payer: PRIVATE HEALTH INSURANCE | Admitting: Adult Health

## 2014-02-14 ENCOUNTER — Telehealth: Payer: Self-pay | Admitting: Pulmonary Disease

## 2014-02-14 NOTE — Telephone Encounter (Signed)
lmomtcb x1 for pt 

## 2014-02-17 ENCOUNTER — Telehealth: Payer: Self-pay | Admitting: Pulmonary Disease

## 2014-02-17 NOTE — Telephone Encounter (Signed)
I spoke with the pt and she states we scheduled a CT for her in [place] but she had to cancel. Pt is requesting that the CT be scheduled in [place] at med center. Pt also states that she cannot go this week or Tuesday, Wed, Thursday of next week. I will [facility] address. Thanks! Carron CurieJennifer Landree Fernholz, CMA

## 2014-02-17 NOTE — Telephone Encounter (Signed)
[facility] 02/28/14@10 :00am pt is aware Tobe SosSally E Ottinger

## 2014-02-17 NOTE — Telephone Encounter (Signed)
Working on this message Alexandria Morris  

## 2014-02-17 NOTE — Telephone Encounter (Signed)
Spoke with Alexandria Morris Alexandria Morris, Alexandria she not have SOB or experience SOB on a regular basis. I tried explaining to the Alexandria Morris that even though she Alexandria not experience SOB on a regular basis Alexandria not make the dx of Morris any less true. Until the disease progresses there may not be any consistent SOB or breathing issues. Would like to know if breathing tests done were in fact a true dx--PFT done at [facility] 2013 Alexandria Morris asking for something else to "verify" that she Alexandria in fact have Morris. Pt states that she has been told for a long time that she had Asthma and now we are telling her its Morris  Please advise Dr Vassie LollAlva. Thanks.

## 2014-02-18 NOTE — Telephone Encounter (Signed)
Can discuss on fu OV

## 2014-02-18 NOTE — Telephone Encounter (Signed)
Called, spoke with pt. Informed her of below per RA.  She verbalized understanding of this and would like to proceed with setting up an OV with RA. This has been scheduled for March 24 at 11:30 am - pt aware and voiced no further questions or concerns at this time.

## 2014-02-25 ENCOUNTER — Other Ambulatory Visit: Payer: PRIVATE HEALTH INSURANCE

## 2014-02-26 ENCOUNTER — Other Ambulatory Visit: Payer: PRIVATE HEALTH INSURANCE

## 2014-02-28 ENCOUNTER — Ambulatory Visit (HOSPITAL_BASED_OUTPATIENT_CLINIC_OR_DEPARTMENT_OTHER)
Admission: RE | Admit: 2014-02-28 | Discharge: 2014-02-28 | Disposition: A | Discharge status: Home / Self Care | Payer: PRIVATE HEALTH INSURANCE | Source: Ambulatory Visit | Attending: Pulmonary Disease | Admitting: Pulmonary Disease

## 2014-02-28 DIAGNOSIS — R918 Other nonspecific abnormal finding of lung field: Secondary | ICD-10-CM | POA: Insufficient documentation

## 2014-03-11 ENCOUNTER — Ambulatory Visit: Payer: PRIVATE HEALTH INSURANCE | Admitting: Pulmonary Disease

## 2014-03-14 ENCOUNTER — Ambulatory Visit (INDEPENDENT_AMBULATORY_CARE_PROVIDER_SITE_OTHER): Payer: Medicare Other | Admitting: Pulmonary Disease

## 2014-03-14 ENCOUNTER — Encounter: Payer: Self-pay | Admitting: Pulmonary Disease

## 2014-03-14 VITALS — BP 110/62 | HR 74 | Temp 98.0°F | Ht 61.5 in | Wt 144.8 lb

## 2014-03-14 DIAGNOSIS — J449 Chronic obstructive pulmonary disease, unspecified: Secondary | ICD-10-CM

## 2014-03-14 DIAGNOSIS — R918 Other nonspecific abnormal finding of lung field: Secondary | ICD-10-CM

## 2014-03-14 NOTE — Patient Instructions (Signed)
Breathing test Use symbicort as needed Call for chest cold or worsening breathing Nodules are stable

## 2014-03-14 NOTE — Progress Notes (Signed)
   Subjective:    Patient ID: Alexandria Morris, female    DOB: [birth year], 78 y.o.   MRN: [id number]  HPI  PCP - Girard CooterKristen Hicks  78 y.o never smoker presents for FU of obstructive airway disease' & BL tiny pulmonary nodules, first noted 08/2012  She was initially told that she had asthma, then on another evaluation in [place], [place], she was told that she had 'COPD' & after coming back to [facility], Dr Tomasita MorrowBeaufort did breathing tests & CXRs told her that she has COPD. She is confused about her diagnosis & wanted a second opinion.   She walks about a mile daily which includes an incline. She used to have attacks of bronchitis which have now resolved. She denies childhood history of asthma.She has a deviated nasal septum.  Spirometry showed FEV1 of 0.57 (31%) & FVC of 1.26 (51%) c/w severe airway obstruction.  She is on Seroquel , klonopin and Effexor from [facility] for OCD & depression  Reflux is controlled on omeprazole.  Reviewed P[facility]s [facility] >> last 05/2012 severe airway obstruction, POS BD response.  Rented a House x 2327 y - had mold, moved out in 2011  Father smoked  .   1670m FU CT chest in 02/2013 showed small bilateral pulmonary nodules.    03/14/2014  Chief Complaint  Patient presents with  . Follow-up    Pt reports breathing is good. Pt tried breo and did not like it. She prefers the symbicort. Occas wheezing at times.    CT chest 02/2014 Scattered pulmonary nodules measuring up to 6 mm, as described  above. 18 month stability  Has questions about 'copd' vs asthma Spireomtry shows Very severe obstruction, , FEV1 0.52 -28% with low vital capacity 1.17-48% She remains relatively asymptomatic, prefers symbicort-  breo did not work for her    Review of Systems neg for any significant sore throat, dysphagia, itching, sneezing, nasal congestion or excess/ purulent secretions, fever, chills, sweats, unintended wt loss, pleuritic or exertional cp, hempoptysis, orthopnea pnd or  change in chronic leg swelling. Also denies presyncope, palpitations, heartburn, abdominal pain, nausea, vomiting, diarrhea or change in bowel or urinary habits, dysuria,hematuria, rash, arthralgias, visual complaints, headache, numbness weakness or ataxia.     Objective:   Physical Exam  Gen. Pleasant, thin woman, in no distress ENT - no lesions, no post nasal drip Neck: No JVD, no thyromegaly, no carotid bruits Lungs: no use of accessory muscles, no dullness to percussion,decreasedwithout rales or rhonchi  Cardiovascular: Rhythm regular, heart sounds  normal, no murmurs or gallops, no peripheral edema Musculoskeletal: No deformities, no cyanosis or clubbing        Assessment & Plan:

## 2014-03-15 NOTE — Assessment & Plan Note (Signed)
Ct symbicort Sine she is relatively asymptomatic, this obsn is likely chronic & will not push meds here. Discussed early signs of bronchitis

## 2014-03-15 NOTE — Assessment & Plan Note (Signed)
Likely benign No FU required

## 2014-06-18 ENCOUNTER — Other Ambulatory Visit: Payer: Self-pay | Admitting: Pulmonary Disease

## 2014-09-04 ENCOUNTER — Encounter: Payer: Self-pay | Admitting: Pulmonary Disease

## 2014-09-04 ENCOUNTER — Ambulatory Visit (INDEPENDENT_AMBULATORY_CARE_PROVIDER_SITE_OTHER): Payer: Medicare Other | Admitting: Pulmonary Disease

## 2014-09-04 VITALS — BP 152/83 | HR 95 | Temp 97.6°F | Ht 60.0 in | Wt 138.0 lb

## 2014-09-04 DIAGNOSIS — G4710 Hypersomnia, unspecified: Secondary | ICD-10-CM | POA: Insufficient documentation

## 2014-09-04 NOTE — Patient Instructions (Signed)
Get back on symbicort - your oxygen level is slightly low today Sleep study will be scheduled Flu shot

## 2014-09-04 NOTE — Progress Notes (Signed)
   Subjective:    Patient ID: Alexandria Morris, female    DOB: 19-Jan-1936, 78 y.o.   MRN: [id number]  HPI  PCP - Girard Cooter  78 y.o never smoker presents for FU of obstructive airway disease' & BL tiny pulmonary nodules, first noted 08/2012  She was initially told that she had asthma, then on another evaluation in [place], [place], she was told that she had 'COPD' & after coming back to [facility], Dr Tomasita Morrow did breathing tests & CXRs told her that she has COPD.    She walks about a mile daily which includes an incline. She used to have attacks of bronchitis which have now resolved. She denies childhood history of asthma.She has a deviated nasal septum.  Spirometry showed FEV1 of 0.57 (31%) & FVC of 1.26 (51%) c/w severe airway obstruction.  She is on Seroquel , klonopin and Effexor from [facility] for OCD & depression  Reflux is controlled on omeprazole.   Rented a House x 21 y - had mold, moved out in 2011  Father smoked   Significant tests/ events  PFTs from [place] >> last 05/2012 severe airway obstruction, POS BD response.  CT chest 02/2014 Scattered pulmonary nodules measuring up to 6 mm, as described  above. 18 month stability   Spiromtry shows Very severe obstruction, , FEV1 0.52 -28% with low vital capacity 1.17-48%    09/04/2014  Chief Complaint  Patient presents with  . COPD    follow-up. Pt denies any increase in SOB. She does state she has been having increase in  productive cough wiht clear phelgm. Pt states she has also been having nasal congestion.   . Atrial Fibrillation    States she was diagnosed with this since last OV.    Hospitalised for rate control ,plaed on cardizem & warfarin She remains relatively asymptomatic, prefers symbicort- breo did not work for her  O2 sat 93% today No pedal edema or orthopnea She is worried about OSA since she feels increased sleepiness ESS 8/24 No loud snoring or witnessed apneas, detailed sleep hisotry  obtained Review of Systems neg for any significant sore throat, dysphagia, itching, sneezing, nasal congestion or excess/ purulent secretions, fever, chills, sweats, unintended wt loss, pleuritic or exertional cp, hempoptysis, orthopnea pnd or change in chronic leg swelling. Also denies presyncope, palpitations, heartburn, abdominal pain, nausea, vomiting, diarrhea or change in bowel or urinary habits, dysuria,hematuria, rash, arthralgias, visual complaints, headache, numbness weakness or ataxia.     Objective:   Physical Exam  Gen. Pleasant, well-nourished, in no distress ENT - no lesions, no post nasal drip Neck: No JVD, no thyromegaly, no carotid bruits Lungs: no use of accessory muscles, no dullness to percussion, decreased without rales or rhonchi  Cardiovascular: Rhythm regular, heart sounds  normal, no murmurs or gallops, no peripheral edema Musculoskeletal: No deformities, no cyanosis or clubbing        Assessment & Plan:

## 2014-09-04 NOTE — Assessment & Plan Note (Signed)
Given excessive daytime somnolence, narrow pharyngeal exam, witnessed apneas & loud snoring, obstructive sleep apnea is very likely & an overnight polysomnogram will be scheduled as a split study. The pathophysiology of obstructive sleep apnea , it's cardiovascular consequences & modes of treatment including CPAP were discused with the patient in detail & they evidenced understanding.  

## 2014-09-05 NOTE — Assessment & Plan Note (Addendum)
Treat as fixed obstruction Get back on symbicort - your oxygen level is slightly low today Flu shot -she prefers the high dose & will get it from her PCP

## 2014-10-27 ENCOUNTER — Telehealth: Payer: Self-pay | Admitting: Pulmonary Disease

## 2014-10-27 NOTE — Telephone Encounter (Signed)
Pt called back [phone]

## 2014-10-27 NOTE — Telephone Encounter (Signed)
lmomtcb x1 

## 2014-10-27 NOTE — Telephone Encounter (Signed)
Pt wants to know if a home sleep study is an option for her, she would rather not have a sleep study in the hospital if possible.  RA please advise.  Thank you!

## 2014-10-27 NOTE — Telephone Encounter (Signed)
Called and spoke with pt. States that the reason she is requesting a sleep study at home is because the night she is scheduled to go have her study done she does not have anyone to bring her.  Pt made aware that she can reschedule her appt to another night that is more convenient for her. Pt given number to sleep lab to call to reschedule. Pt to come and call if anything further needed.

## 2014-10-27 NOTE — Telephone Encounter (Signed)
Ideally not because of her lung issues, but OK to go with home study first

## 2014-10-27 NOTE — Telephone Encounter (Signed)
Called spoke with pt. She is asking to speak with PCC's about scheduling her sleep study. Please advise thanks

## 2014-10-28 NOTE — Telephone Encounter (Signed)
Pt rescheduled split night study to 01/13/15@8pm  paperwork mailed to pt Alexandria SosSally E Ottinger

## 2014-11-02 ENCOUNTER — Encounter (HOSPITAL_BASED_OUTPATIENT_CLINIC_OR_DEPARTMENT_OTHER): Payer: Medicare Other

## 2015-01-13 ENCOUNTER — Encounter (HOSPITAL_BASED_OUTPATIENT_CLINIC_OR_DEPARTMENT_OTHER): Payer: Medicare Other

## 2015-01-15 ENCOUNTER — Ambulatory Visit: Payer: Medicare Other | Admitting: Pulmonary Disease

## 2015-01-17 ENCOUNTER — Emergency Department (HOSPITAL_BASED_OUTPATIENT_CLINIC_OR_DEPARTMENT_OTHER)
Admission: EM | Admit: 2015-01-17 | Discharge: 2015-01-17 | Disposition: A | Discharge status: Home / Self Care | Payer: Medicare Other | Attending: Emergency Medicine | Admitting: Emergency Medicine

## 2015-01-17 ENCOUNTER — Emergency Department (HOSPITAL_BASED_OUTPATIENT_CLINIC_OR_DEPARTMENT_OTHER): Payer: Medicare Other

## 2015-01-17 ENCOUNTER — Encounter (HOSPITAL_BASED_OUTPATIENT_CLINIC_OR_DEPARTMENT_OTHER): Payer: Self-pay

## 2015-01-17 DIAGNOSIS — J449 Chronic obstructive pulmonary disease, unspecified: Secondary | ICD-10-CM | POA: Diagnosis not present

## 2015-01-17 DIAGNOSIS — Z872 Personal history of diseases of the skin and subcutaneous tissue: Secondary | ICD-10-CM | POA: Insufficient documentation

## 2015-01-17 DIAGNOSIS — M810 Age-related osteoporosis without current pathological fracture: Secondary | ICD-10-CM | POA: Insufficient documentation

## 2015-01-17 DIAGNOSIS — K219 Gastro-esophageal reflux disease without esophagitis: Secondary | ICD-10-CM | POA: Diagnosis not present

## 2015-01-17 DIAGNOSIS — F42 Obsessive-compulsive disorder: Secondary | ICD-10-CM | POA: Diagnosis not present

## 2015-01-17 DIAGNOSIS — E780 Pure hypercholesterolemia: Secondary | ICD-10-CM | POA: Insufficient documentation

## 2015-01-17 DIAGNOSIS — I10 Essential (primary) hypertension: Secondary | ICD-10-CM | POA: Diagnosis not present

## 2015-01-17 DIAGNOSIS — M1990 Unspecified osteoarthritis, unspecified site: Secondary | ICD-10-CM | POA: Insufficient documentation

## 2015-01-17 DIAGNOSIS — Z7901 Long term (current) use of anticoagulants: Secondary | ICD-10-CM | POA: Insufficient documentation

## 2015-01-17 DIAGNOSIS — G4700 Insomnia, unspecified: Secondary | ICD-10-CM | POA: Diagnosis not present

## 2015-01-17 DIAGNOSIS — F329 Major depressive disorder, single episode, unspecified: Secondary | ICD-10-CM | POA: Insufficient documentation

## 2015-01-17 DIAGNOSIS — R197 Diarrhea, unspecified: Secondary | ICD-10-CM | POA: Diagnosis present

## 2015-01-17 DIAGNOSIS — Z79899 Other long term (current) drug therapy: Secondary | ICD-10-CM | POA: Insufficient documentation

## 2015-01-17 DIAGNOSIS — M8580 Other specified disorders of bone density and structure, unspecified site: Secondary | ICD-10-CM | POA: Diagnosis not present

## 2015-01-17 DIAGNOSIS — E876 Hypokalemia: Secondary | ICD-10-CM | POA: Insufficient documentation

## 2015-01-17 LAB — POTASSIUM: POTASSIUM: 2.7 mmol/L — AB (ref 3.5–5.1)

## 2015-01-17 LAB — CBC
HCT: 34.5 % — ABNORMAL LOW (ref 36.0–46.0)
HEMOGLOBIN: 11.6 g/dL — AB (ref 12.0–15.0)
MCH: 31.2 pg (ref 26.0–34.0)
MCHC: 33.6 g/dL (ref 30.0–36.0)
MCV: 92.7 fL (ref 78.0–100.0)
PLATELETS: 229 10*3/uL (ref 150–400)
RBC: 3.72 MIL/uL — ABNORMAL LOW (ref 3.87–5.11)
RDW: 12 % (ref 11.5–15.5)
WBC: 8.7 10*3/uL (ref 4.0–10.5)

## 2015-01-17 LAB — LIPASE, BLOOD: LIPASE: 24 U/L (ref 11–59)

## 2015-01-17 LAB — URINALYSIS, ROUTINE W REFLEX MICROSCOPIC
BILIRUBIN URINE: NEGATIVE
GLUCOSE, UA: NEGATIVE mg/dL
HGB URINE DIPSTICK: NEGATIVE
Ketones, ur: NEGATIVE mg/dL
Nitrite: NEGATIVE
PROTEIN: NEGATIVE mg/dL
SPECIFIC GRAVITY, URINE: 1.003 — AB (ref 1.005–1.030)
Urobilinogen, UA: 0.2 mg/dL (ref 0.0–1.0)
pH: 6.5 (ref 5.0–8.0)

## 2015-01-17 LAB — COMPREHENSIVE METABOLIC PANEL
ALBUMIN: 2.7 g/dL — AB (ref 3.5–5.2)
ALK PHOS: 70 U/L (ref 39–117)
ALT: 26 U/L (ref 0–35)
AST: 23 U/L (ref 0–37)
BILIRUBIN TOTAL: 0.3 mg/dL (ref 0.3–1.2)
BUN: 8 mg/dL (ref 6–23)
CALCIUM: 8.1 mg/dL — AB (ref 8.4–10.5)
CHLORIDE: 101 mmol/L (ref 96–112)
CO2: 28 mmol/L (ref 19–32)
CREATININE: 0.85 mg/dL (ref 0.50–1.10)
GFR calc Af Amer: 75 mL/min — ABNORMAL LOW (ref >90)
GFR, EST NON AFRICAN AMERICAN: 65 mL/min — AB (ref >90)
Glucose, Bld: 104 mg/dL — ABNORMAL HIGH (ref 70–99)
Potassium: 2.5 mmol/L — CL (ref 3.5–5.1)
SODIUM: 131 mmol/L — AB (ref 135–145)
Total Protein: 5.4 g/dL — ABNORMAL LOW (ref 6.0–8.3)

## 2015-01-17 LAB — URINE MICROSCOPIC-ADD ON

## 2015-01-17 MED ORDER — POTASSIUM CHLORIDE 10 MEQ/100ML IV SOLN
10.0000 meq | INTRAVENOUS | Status: AC
  Administered 2015-01-17 (×2): 10 meq via INTRAVENOUS
  Filled 2015-01-17 (×2): qty 100

## 2015-01-17 MED ORDER — POTASSIUM CHLORIDE CRYS ER 20 MEQ PO TBCR
40.0000 meq | EXTENDED_RELEASE_TABLET | Freq: Once | ORAL | Status: AC
  Administered 2015-01-17: 40 meq via ORAL
  Filled 2015-01-17: qty 2

## 2015-01-17 MED ORDER — POTASSIUM CHLORIDE ER 10 MEQ PO TBCR: 40.0000 meq | EXTENDED_RELEASE_TABLET | Freq: Every day | ORAL | Status: DC

## 2015-01-17 MED ORDER — SODIUM CHLORIDE 0.9 % IV BOLUS (SEPSIS)
500.0000 mL | Freq: Once | INTRAVENOUS | Status: AC
  Administered 2015-01-17: 500 mL via INTRAVENOUS

## 2015-01-17 NOTE — ED Notes (Signed)
Patient here with watery diarrhea x 1 week. Reports that she has had multiple episodes of diarrhea the past 24 hours. Denies abdominal pain, denies vomiting. Patient complains of weakness

## 2015-01-17 NOTE — ED Notes (Signed)
Potassium of 2.5 reported to RN and Linker MD

## 2015-01-17 NOTE — Discharge Instructions (Signed)
Return to the ED with any concerns including abdominal pain, fever/chills, vomiting and not able to keep down liquids, blood in stool, fainting, decreased level of alertness/lethargy, or any other alarming symptoms

## 2015-01-17 NOTE — ED Provider Notes (Signed)
CSN: [id number]     Arrival date & time 01/17/15  1138 History   First MD Initiated Contact with Patient 01/17/15 1156     Chief Complaint  Patient presents with  . Diarrhea     (Consider location/radiation/quality/duration/timing/severity/associated sxs/prior Treatment) HPI  Pt presenting with c/o diarrhea.  She states she has been having watery bowel movements, multiple times per day over the past week.  She denies blood in stool.  Stools have been liquid.  No fever or abdominal pain.  Denies dysuria.  She came in today because she was feeling more weak.  No lightheadedness, no syncope.  She was recently hospitalized for pneumonia and is feeling improved from that standpoint- she was on abx for her pneumonia. .  No sick contacts, no recent travel.   There are no other associated systemic symptoms, there are no other alleviating or modifying factors.   Past Medical History  Diagnosis Date  . Acute bronchitis   . Acute sinusitis   . Cellulitis   . Chest pain   . Depression   . Hypercholesteremia   . Hyperglycemia   . Hypertension   . Microalbuminuria   . Osteoporosis   . Asthma   . Ataxia   . COPD (chronic obstructive pulmonary disease)   . Dermatitis   . Reflux   . HTN (hypertension)   . Insomnia   . IBS (irritable bowel syndrome)   . OCD (obsessive compulsive disorder)   . Osteoarthritis   . Osteopenia   . Incontinence of urine    Past Surgical History  Procedure Laterality Date  . Bladder surgery    . Cataract extraction    . Hernia repair     Family History  Problem Relation Age of Onset  . Heart disease    . Breast cancer    . Coronary artery disease    . Depression    . Diabetes    . Hyperlipidemia    . Hypertension    . Osteoporosis    . Kidney disease    . Lung disease    . Stroke     History  Substance Use Topics  . Smoking status: Never Smoker   . Smokeless tobacco: Never Used  . Alcohol Use: No   OB History    No data available     Review  of Systems  ROS reviewed and all otherwise negative except for mentioned in HPI    Allergies  Albuterol; Bentyl; Claritin; Haldol decanoate; Pravastatin; Simvastatin; and Triavil  Home Medications   Prior to Admission medications   Medication Sig Start Date End Date Taking? Authorizing Provider  acetaminophen (TYLENOL) 500 MG tablet Take 500 mg by mouth every 4 (four) hours as needed.    Historical Provider, MD  amLODipine (NORVASC) 5 MG tablet Take 5 mg by mouth daily.    Historical Provider, MD  Cholecalciferol (VITAMIN D) 400 UNITS capsule Take 1 capsule by mouth daily.    Historical Provider, MD  clonazePAM (KLONOPIN) 0.5 MG tablet Take 0.5-1 mg by mouth 2 (two) times daily as needed.    Historical Provider, MD  colesevelam (WELCHOL) 625 MG tablet Take 1,875 mg by mouth 2 (two) times daily. 07/04/14 07/04/15  Historical Provider, MD  diltiazem (CARDIZEM CD) 240 MG 24 hr capsule Take 240 mg by mouth daily. 08/16/14   Historical Provider, MD  fish oil-omega-3 fatty acids 1000 MG capsule Take 1 g by mouth daily.    Historical Provider, MD  fluticasone (FLONASE) 50  MCG/ACT nasal spray Place 2 sprays into the nose as needed.     Historical Provider, MD  loteprednol (LOTEMAX) 0.5 % ophthalmic suspension Place 1 drop into the right eye daily.    Historical Provider, MD  Multiple Vitamin (MULTIVITAMIN) tablet Take 1 tablet by mouth daily.    Historical Provider, MD  Multiple Vitamins-Minerals (PRESERVISION/LUTEIN PO) Take by mouth daily.    Historical Provider, MD  olmesartan (BENICAR) 20 MG tablet Take 20 mg by mouth daily.    Historical Provider, MD  omeprazole (PRILOSEC) 40 MG capsule Take 20 mg by mouth daily.     Historical Provider, MD  QUEtiapine (SEROQUEL) 25 MG tablet Take 50 mg by mouth at bedtime.    Historical Provider, MD  ranitidine (ZANTAC) 150 MG capsule Take 1 capsule by mouth at bedtime. 07/25/14   Historical Provider, MD  SYMBICORT 80-4.5 MCG/ACT inhaler INHALE 2 PUFFS INTO THE  LUNGS 2 (TWO) TIMES DAILY.    Oretha Milch, MD  venlafaxine XR (EFFEXOR XR) 37.5 MG 24 hr capsule Take 1 capsule by mouth daily. 08/06/14   Historical Provider, MD  warfarin (COUMADIN) 5 MG tablet Take 1 tablet by mouth as directed. 09/01/14   Historical Provider, MD   BP 121/50 mmHg  Pulse 80  Temp(Src) 97.9 F (36.6 C) (Oral)  Resp 18  Wt 137 lb (62.143 kg)  SpO2 95%  Vitals reviewed Physical Exam  Physical Examination: General appearance - alert, well appearing, and in no distress Mental status - alert, oriented to person, place, and time Eyes - no conjunctival injection, no scleral icterus Mouth - mucous membranes moist, pharynx normal without lesions Chest - clear to auscultation, no wheezes, rales or rhonchi, symmetric air entry Heart - normal rate, regular rhythm, normal S1, S2, no murmurs, rubs, clicks or gallops Abdomen - soft, nontender, nondistended, no masses or organomegaly, nabs Extremities - peripheral pulses normal, no pedal edema, no clubbing or cyanosis Skin - normal coloration and turgor, no rashes  ED Course  Procedures (including critical care time)  2:41 PM on recheck patient states she is feeling much improved.  She is requesting discharge.   She has not had diarrhea while in the ED. I have discussed her low potassium with her and she would prefer to have this repleted in the ED.  Pt has had po potassium and is getting IV potassium now.  She is agreeable to have labs rechecked at 5pm.   Labs Review Labs Reviewed  CBC - Abnormal; Notable for the following:    RBC 3.72 (*)    Hemoglobin 11.6 (*)    HCT 34.5 (*)    All other components within normal limits  COMPREHENSIVE METABOLIC PANEL - Abnormal; Notable for the following:    Sodium 131 (*)    Potassium 2.5 (*)    Glucose, Bld 104 (*)    Calcium 8.1 (*)    Total Protein 5.4 (*)    Albumin 2.7 (*)    GFR calc non Af Amer 65 (*)    GFR calc Af Amer 75 (*)    Anion gap <3 (*)    All other components  within normal limits  URINALYSIS, ROUTINE W REFLEX MICROSCOPIC - Abnormal; Notable for the following:    Specific Gravity, Urine 1.003 (*)    Leukocytes, UA SMALL (*)    All other components within normal limits  CLOSTRIDIUM DIFFICILE BY PCR  STOOL CULTURE  LIPASE, BLOOD  URINE MICROSCOPIC-ADD ON    Imaging Review Dg  Chest 2 View  01/17/2015   CLINICAL DATA:  Patient here with watery diarrhea x 1 week. Reports that she has had multiple episodes of diarrhea the past 24 hours. Denies abdominal pain, denies vomiting. Patient complains of weakness. Hx of htn, asthma, copd and asthma. Recent pna  EXAM: CHEST  2 VIEW  COMPARISON:  01/06/2015  FINDINGS: Cardiac silhouette normal in size and configuration. No mediastinal or hilar masses or evidence of adenopathy.  Patchy areas of lung opacity noted previously have resolved. There are no areas of lung consolidation. There is no pulmonary edema. Mild stable scarring is noted in the apices.  No pleural effusion or pneumothorax.  Bony thorax is demineralized. There is a scoliosis of the thoracolumbar spine.  IMPRESSION: No acute cardiopulmonary disease.  Patchy areas of pneumonia noted previously have essentially resolved.   Electronically Signed   By: Amie Portland M.D.   On: 01/17/2015 13:10     EKG Interpretation None      MDM   Final diagnoses:  Diarrhea  Hypokalemia    Pt presenting with watery diarrhea over the past week.  Pt feels much imrpoved after IV fluids.  Her urine does not show signs of dehydration.  Pt is well appearing with a benign abdominal exam.  Pt has not had diarrhea since arriving the ED, but will check stool culture and cdif if she is able to give stool specimen, otherwise will send home with specimen container.  Pt has hypokalemia- has been given po potassium and IV runs as well.  Will recheck K at 5pm and anticipate discharge at that time.      Ethelda Chick, MD 01/17/15 [phone]

## 2015-01-17 NOTE — ED Notes (Addendum)
No diarrhea since admission to ER, patient drinking liquids

## 2015-01-17 NOTE — ED Notes (Signed)
Patient did not have diarrhea during time at ER

## 2015-01-17 NOTE — ED Provider Notes (Signed)
Care assumed from Dr. Karma GanjaLinker.  Potassium has started to improve.  Pt feels well.  She has not had any diarrhea here.  Will dc with additional PO potassium and she will follow up with her PCP this coming week for a recheck.    Clinical Impression: 1. Diarrhea   2. Hypokalemia       Alexandria ChurnJohn David Ardian Haberland III, MD 01/17/15 [phone]

## 2015-02-12 ENCOUNTER — Other Ambulatory Visit: Payer: Self-pay | Admitting: Pulmonary Disease

## 2015-03-05 ENCOUNTER — Ambulatory Visit: Payer: Medicaid Other | Admitting: Pulmonary Disease

## 2015-03-26 ENCOUNTER — Ambulatory Visit: Payer: Medicaid Other | Admitting: Pulmonary Disease

## 2015-04-07 ENCOUNTER — Encounter: Payer: Self-pay | Admitting: Adult Health

## 2015-04-07 ENCOUNTER — Ambulatory Visit (INDEPENDENT_AMBULATORY_CARE_PROVIDER_SITE_OTHER): Payer: Medicare Other | Admitting: Adult Health

## 2015-04-07 VITALS — BP 124/70 | HR 68 | Temp 97.8°F | Ht 60.0 in | Wt 139.2 lb

## 2015-04-07 DIAGNOSIS — J449 Chronic obstructive pulmonary disease, unspecified: Secondary | ICD-10-CM

## 2015-04-07 DIAGNOSIS — G4710 Hypersomnia, unspecified: Secondary | ICD-10-CM | POA: Diagnosis not present

## 2015-04-07 MED ORDER — ALBUTEROL SULFATE HFA 108 (90 BASE) MCG/ACT IN AERS: 2.0000 | INHALATION_SPRAY | RESPIRATORY_TRACT | Status: AC | PRN

## 2015-04-07 NOTE — Progress Notes (Signed)
Subjective:    Patient ID: Alexandria Morris, female    DOB: [birth year], 79 y.o.   MRN: [id number]  HPI  PCP - Girard CooterKristen Hicks  79 y.o never smoker presents for FU of obstructive airway disease' & BL tiny pulmonary nodules, first noted 08/2012  She was initially told that she had asthma, then [place], she was told that she had 'COPD' & after coming back to [facility], Dr Tomasita MorrowBeaufort did breathing tests & CXRs told her that she has COPD.    She walks about a mile daily which includes an incline. She used to have attacks of bronchitis which have now resolved. She denies childhood history of asthma.She has a deviated nasal septum.  Spirometry showed FEV1 of 0.57 (31%) & FVC of 1.26 (51%) c/w severe airway obstruction.  She is on Seroquel , klonopin and Effexor from [facility] for OCD & depression  Reflux is controlled on omeprazole.   Rented a House x 3127 y - had mold, moved out in 2011  Father smoked   Significant tests/ events  [place] last 05/2012 severe airway obstruction, POS BD response.  CT chest 02/2014 Scattered pulmonary nodules measuring up to 6 mm, as described  above. 18 month stability   Spiromtry shows Very severe obstruction, , FEV1 0.52 -28% with low vital capacity 1.17-48%    09/04/2014 Chief Complaint  Patient presents with  . COPD    follow-up. Pt denies any increase in SOB. She does state she has been having increase in  productive cough wiht clear phelgm. Pt states she has also been having nasal congestion.   . Atrial Fibrillation    States she was diagnosed with this since last OV.    Hospitalised for rate control ,plaed on cardizem & warfarin She remains relatively asymptomatic, prefers symbicort- breo did not work for her  O2 sat 93% today No pedal edema or orthopnea She is worried about OSA since she feels increased sleepiness ESS 8/24 No loud snoring or witnessed apneas, detailed sleep hisotry  obtained   04/07/2015  6 month  Follow up : Severe airway obstructive /never smoker  Pt returns for a follow up for Asthma.  She is on Symbicort, doing well.  No increased SABA use.  No flare of cough or wheezing.  She was set up for sleep study last ov due to snoring, daytime sleepiness.  Insurance will not cover home sleep study , set up for split night but she was  Unable to arrange transportation, we discussed this today and she wishes to have  This rescheduled.  She denies any chest pain, orthopnea, PND or leg swelling.  Did have PNA in Jan this year, tx w /abx, did well. Follow up cxr showed clearance.     Review of Systems neg for any significant sore throat, dysphagia, itching, sneezing, nasal congestion or excess/ purulent secretions, fever, chills, sweats, unintended wt loss, pleuritic or exertional cp, hempoptysis, orthopnea pnd or change in chronic leg swelling. Also denies presyncope, palpitations, heartburn, abdominal pain, nausea, vomiting, diarrhea or change in bowel or urinary habits, dysuria,hematuria, rash, arthralgias, visual complaints, headache, numbness weakness or ataxia.     Objective:   Physical Exam  Gen. Pleasant, elderly , in no distress ENT - no lesions, no post nasal drip Neck: No JVD, no thyromegaly, no carotid bruits Lungs: no use of accessory muscles, no dullness to percussion, decreased without rales or rhonchi  Cardiovascular: Rhythm regular, heart sounds  normal, no murmurs or gallops, no peripheral edema Musculoskeletal: No deformities, no cyanosis or clubbing        Assessment & Plan:

## 2015-04-07 NOTE — Addendum Note (Signed)
Addended by: Boone MasterJONES, JESSICA E on: 04/07/2015 02:18 PM   Modules accepted: Orders

## 2015-04-07 NOTE — Progress Notes (Signed)
Reviewed & agree with plan  

## 2015-04-07 NOTE — Patient Instructions (Addendum)
Continue on current regimen .  We will reschedule sleep study .  follow up Dr. Vassie LollAlva  In 4 months and As needed

## 2015-04-07 NOTE — Assessment & Plan Note (Signed)
Reschedule  split night sleep study

## 2015-04-07 NOTE — Assessment & Plan Note (Signed)
Severe airway obstruction /fixed asthma /never smoker  Doing well on Symbicort  Cont on current regimen .

## 2015-04-20 ENCOUNTER — Ambulatory Visit: Payer: Medicaid Other | Admitting: Pulmonary Disease

## 2015-06-16 ENCOUNTER — Encounter (HOSPITAL_BASED_OUTPATIENT_CLINIC_OR_DEPARTMENT_OTHER): Payer: Medicaid Other

## 2015-07-17 ENCOUNTER — Encounter: Payer: Self-pay | Admitting: Pulmonary Disease

## 2015-07-17 ENCOUNTER — Ambulatory Visit (INDEPENDENT_AMBULATORY_CARE_PROVIDER_SITE_OTHER): Payer: Medicare Other | Admitting: Pulmonary Disease

## 2015-07-17 VITALS — BP 138/68 | HR 75 | Ht 60.0 in | Wt 140.0 lb

## 2015-07-17 DIAGNOSIS — R918 Other nonspecific abnormal finding of lung field: Secondary | ICD-10-CM | POA: Diagnosis not present

## 2015-07-17 DIAGNOSIS — G4710 Hypersomnia, unspecified: Secondary | ICD-10-CM

## 2015-07-17 DIAGNOSIS — J449 Chronic obstructive pulmonary disease, unspecified: Secondary | ICD-10-CM | POA: Diagnosis not present

## 2015-07-17 NOTE — Progress Notes (Signed)
   Subjective:    Patient ID: Alexandria Morris, female    DOB: [birth year], 79 y.o.   MRN: [id number]  HPI  PCP - Girard Cooter  79 y.o never smoker presents for FU of obstructive airway disease- attributed to 'fixed' long-standing asthma & BL tiny pulmonary nodules, first noted 08/2012    She walks about a mile daily which includes an incline. She used to have attacks of bronchitis which have now resolved. She denies childhood history of asthma.She has a deviated nasal septum.   She is on Seroquel , klonopin and Effexor from [facility] for OCD & depression  Reflux is controlled on omeprazole.   Rented a House x 45 y - had mold, moved out in 2011  Father smoked    07/17/2015  Chief Complaint  Patient presents with  . Follow-up    Breathing doing well.  Not sleeping well.  Patient cancelled Sleep Study, says she needs to get rescheduled.    She remains relatively asymptomatic, prefers symbicort- breo did not work for her  No pedal edema or orthopnea  She is on Symbicort, doing well.  No increased SABA use.  No flare of cough or wheezing.  She was set up for sleep study last ov due to snoring, daytime sleepiness.  Insurance will not cover home sleep study , set up for split night but she was Unable to arrange transportation, she wishes to have this rescheduled.  She denies any chest pain, orthopnea, PND or leg swelling.  Spirometry fev1 39%  Significant tests/ events  [place]FTs [place] last 05/2012 severe airway obstruction, POS BD response.  CT chest 02/2014 Scattered pulmonary nodules measuring up to 6 mm, as described  above. 18 month stability   Spirometry - FEV1 of 0.57 (31%) & FVC of 1.26 (51%) c/w severe airway obstruction.  02/2014 Spirometry - Very severe obstruction, , FEV1 0.52 -28% with low vital capacity 1.17-48%   09/04/2014 >>Hospitalised for rate control , on cardizem & warfarin  12/2014 [facility] - cleared on CXR  Review of Systems neg for any significant  sore throat, dysphagia, itching, sneezing, nasal congestion or excess/ purulent secretions, fever, chills, sweats, unintended wt loss, pleuritic or exertional cp, hempoptysis, orthopnea pnd or change in chronic leg swelling. Also denies presyncope, palpitations, heartburn, abdominal pain, nausea, vomiting, diarrhea or change in bowel or urinary habits, dysuria,hematuria, rash, arthralgias, visual complaints, headache, numbness weakness or ataxia.     Objective:   Physical Exam  Gen. Pleasant, thin, in no distress ENT - no lesions, no post nasal drip Neck: No JVD, no thyromegaly, no carotid bruits Lungs: no use of accessory muscles, no dullness to percussion, decreased without rales or rhonchi  Cardiovascular: Rhythm regular, heart sounds  normal, no murmurs or gallops, no peripheral edema Musculoskeletal: No deformities, no cyanosis or clubbing        Assessment & Plan:

## 2015-07-17 NOTE — Patient Instructions (Signed)
Lung function is slight better Stay on symbicort Schedule sleep study

## 2015-07-17 NOTE — Assessment & Plan Note (Addendum)
Lung function is slight better , although she continues to have quite severe airway obstruction. Surprisingly she is not symptomatic from this. Stay on symbicort -minimise albuterol usage to prn since AF concerns

## 2015-07-17 NOTE — Assessment & Plan Note (Signed)
Likely benign, does not need follow-up in this never smoker

## 2015-07-27 ENCOUNTER — Ambulatory Visit: Payer: Medicare Other | Admitting: Pulmonary Disease

## 2015-08-13 ENCOUNTER — Ambulatory Visit: Payer: Medicare Other | Admitting: Pulmonary Disease

## 2015-09-24 ENCOUNTER — Other Ambulatory Visit: Payer: Self-pay | Admitting: Pulmonary Disease

## 2015-10-06 ENCOUNTER — Encounter (HOSPITAL_BASED_OUTPATIENT_CLINIC_OR_DEPARTMENT_OTHER): Payer: Medicare Other

## 2015-11-05 ENCOUNTER — Other Ambulatory Visit: Payer: Self-pay | Admitting: Pulmonary Disease

## 2015-12-22 ENCOUNTER — Ambulatory Visit: Payer: Medicare Other | Admitting: Adult Health

## 2016-01-22 ENCOUNTER — Ambulatory Visit: Payer: Medicare Other | Admitting: Adult Health

## 2016-05-13 ENCOUNTER — Encounter: Payer: Self-pay | Admitting: Emergency Medicine

## 2016-05-13 ENCOUNTER — Emergency Department (INDEPENDENT_AMBULATORY_CARE_PROVIDER_SITE_OTHER)
Admission: EM | Admit: 2016-05-13 | Discharge: 2016-05-13 | Disposition: A | Discharge status: Home / Self Care | Payer: Medicare Other | Source: Home / Self Care | Attending: Family Medicine | Admitting: Family Medicine

## 2016-05-13 DIAGNOSIS — M79662 Pain in left lower leg: Secondary | ICD-10-CM | POA: Diagnosis not present

## 2016-05-13 NOTE — ED Notes (Signed)
Pt c/o left leg (calf) swelling, warmth and knot that she noticed yesterday. Denies pain.

## 2016-05-13 NOTE — ED Provider Notes (Signed)
CSN: [id number]     Arrival date & time 05/13/16  2024 History   First MD Initiated Contact with Patient 05/13/16 2042     Chief Complaint  Patient presents with  . Leg Swelling      HPI Comments: Patient complains of onset of swelling, warmth, and a "knot" in her left lower leg yesterday.  She denies chest pain, shortness of breath, headache, neurologic symptoms.  She presently takes Eliquis.  The history is provided by the patient.    Past Medical History  Diagnosis Date  . Acute bronchitis   . Acute sinusitis   . Cellulitis   . Chest pain   . Depression   . Hypercholesteremia   . Hyperglycemia   . Hypertension   . Microalbuminuria   . Osteoporosis   . Asthma   . Ataxia   . COPD (chronic obstructive pulmonary disease) (HCC)   . Dermatitis   . Reflux   . HTN (hypertension)   . Insomnia   . IBS (irritable bowel syndrome)   . OCD (obsessive compulsive disorder)   . Osteoarthritis   . Osteopenia   . Incontinence of urine    Past Surgical History  Procedure Laterality Date  . Bladder surgery    . Cataract extraction    . Hernia repair     Family History  Problem Relation Age of Onset  . Heart disease    . Breast cancer    . Coronary artery disease    . Depression    . Diabetes    . Hyperlipidemia    . Hypertension    . Osteoporosis    . Kidney disease    . Lung disease    . Stroke     Social History  Substance Use Topics  . Smoking status: Never Smoker   . Smokeless tobacco: Never Used  . Alcohol Use: No   OB History    No data available     Review of Systems  Constitutional: Negative for fever, chills, diaphoresis and fatigue.  HENT: Negative.   Eyes: Negative.   Respiratory: Negative for cough, chest tightness and shortness of breath.   Cardiovascular: Positive for leg swelling. Negative for chest pain.  Gastrointestinal: Negative.   Genitourinary: Negative.   Musculoskeletal: Negative.   Skin: Negative.   Neurological: Negative for  headaches.    Allergies  Metoprolol tartrate; Albuterol; Bentyl; Brimonidine; Cetirizine; Claritin; Fexofenadine; Haldol decanoate; Lovaza; Pravastatin; Simvastatin; and Triavil  Home Medications   Prior to Admission medications   Medication Sig Start Date End Date Taking? Authorizing Provider  acetaminophen (TYLENOL) 500 MG tablet Take 500 mg by mouth every 4 (four) hours as needed.    Historical Provider, MD  albuterol (PROAIR HFA) 108 (90 BASE) MCG/ACT inhaler Inhale 2 puffs into the lungs every 4 (four) hours as needed for wheezing or shortness of breath. 04/07/15   Tammy S Parrett, NP  Cholecalciferol (VITAMIN D) 400 UNITS capsule Take 1 capsule by mouth daily.    Historical Provider, MD  clonazePAM (KLONOPIN) 0.5 MG tablet Take 0.5-1 mg by mouth 3 (three) times daily as needed.     Historical Provider, MD  diltiazem (CARDIZEM CD) 240 MG 24 hr capsule Take 240 mg by mouth daily. 08/16/14   Historical Provider, MD  fish oil-omega-3 fatty acids 1000 MG capsule Take 1 g by mouth daily.    Historical Provider, MD  Multiple Vitamins-Minerals (PRESERVISION AREDS 2) CAPS Take 1 capsule by mouth 2 (two) times daily.  Historical Provider, MD  olmesartan (BENICAR) 20 MG tablet Take 20 mg by mouth daily.    Historical Provider, MD  omeprazole (PRILOSEC) 40 MG capsule Take 20 mg by mouth daily.     Historical Provider, MD  QUEtiapine (SEROQUEL) 25 MG tablet Take 25 mg by mouth at bedtime.     Historical Provider, MD  sodium chloride (OCEAN) 0.65 % SOLN nasal spray Place 1 spray into both nostrils as needed for congestion.    Historical Provider, MD  SYMBICORT 80-4.5 MCG/ACT inhaler INHALE 2 PUFFS INTO THE LUNGS 2 (TWO) TIMES DAILY. 09/24/15   Oretha Milchakesh Alva V, MD  SYMBICORT 80-4.5 MCG/ACT inhaler INHALE 2 PUFFS INTO THE LUNGS 2 (TWO) TIMES DAILY. 11/05/15   Oretha Milchakesh Alva V, MD  Triamcinolone Acetonide (NASACORT ALLERGY 24HR NA) Place into the nose.    Historical Provider, MD  venlafaxine XR (EFFEXOR XR)  37.5 MG 24 hr capsule Take 1 capsule by mouth daily. 08/06/14   Historical Provider, MD  warfarin (COUMADIN) 5 MG tablet Take 1 tablet by mouth as directed. 09/01/14   Historical Provider, MD   Meds Ordered and Administered this Visit  Medications - No data to display  BP 110/70 mmHg  Pulse 91  Temp(Src) 97.8 F (36.6 C) (Oral)  Wt 134 lb (60.782 kg)  SpO2 96% No data found.   Physical Exam  Constitutional: She is oriented to person, place, and time. She appears well-developed and well-nourished. No distress.  HENT:  Head: Normocephalic.  Eyes: Conjunctivae are normal. Pupils are equal, round, and reactive to light.  Neck: Neck supple.  Cardiovascular: Normal heart sounds.   Pulmonary/Chest: Breath sounds normal.  Abdominal: Bowel sounds are normal. There is no tenderness.  Musculoskeletal: She exhibits edema.       Left lower leg: She exhibits tenderness and swelling.       Legs: Left posterior calf has tenderness to palpation as noted on diagram.    Neurological: She is alert and oriented to person, place, and time.  Skin: Skin is warm and dry. No rash noted.  Nursing note and vitals reviewed.   ED Course  Procedures none   MDM   1. Calf pain, left    Concern for Left DVT, despite Eliquis thromboembolism prophylaxis. [facility]    Lattie HawStephen A Kjersten Ormiston, MD 05/19/16 1721

## 2020-04-23 ENCOUNTER — Encounter (INDEPENDENT_AMBULATORY_CARE_PROVIDER_SITE_OTHER): Payer: 59 | Admitting: Ophthalmology

## 2020-04-29 ENCOUNTER — Encounter (INDEPENDENT_AMBULATORY_CARE_PROVIDER_SITE_OTHER): Payer: 59 | Admitting: Ophthalmology

## 2020-05-04 ENCOUNTER — Ambulatory Visit (INDEPENDENT_AMBULATORY_CARE_PROVIDER_SITE_OTHER): Payer: 59 | Admitting: Ophthalmology

## 2020-05-04 ENCOUNTER — Encounter (INDEPENDENT_AMBULATORY_CARE_PROVIDER_SITE_OTHER): Payer: Self-pay | Admitting: Ophthalmology

## 2020-05-04 DIAGNOSIS — H353114 Nonexudative age-related macular degeneration, right eye, advanced atrophic with subfoveal involvement: Secondary | ICD-10-CM

## 2020-05-04 DIAGNOSIS — H353212 Exudative age-related macular degeneration, right eye, with inactive choroidal neovascularization: Secondary | ICD-10-CM | POA: Insufficient documentation

## 2020-05-04 DIAGNOSIS — H3561 Retinal hemorrhage, right eye: Secondary | ICD-10-CM

## 2020-05-04 DIAGNOSIS — H353122 Nonexudative age-related macular degeneration, left eye, intermediate dry stage: Secondary | ICD-10-CM | POA: Diagnosis not present

## 2020-05-04 DIAGNOSIS — H353211 Exudative age-related macular degeneration, right eye, with active choroidal neovascularization: Secondary | ICD-10-CM

## 2020-05-04 DIAGNOSIS — H35371 Puckering of macula, right eye: Secondary | ICD-10-CM

## 2020-05-04 DIAGNOSIS — H35363 Drusen (degenerative) of macula, bilateral: Secondary | ICD-10-CM | POA: Diagnosis not present

## 2020-05-04 MED ORDER — BEVACIZUMAB CHEMO INJECTION 1.25MG/0.05ML SYRINGE FOR KALEIDOSCOPE
1.2500 mg | INTRAVITREAL | Status: AC | PRN
  Administered 2020-05-04: 1.25 mg via INTRAVITREAL

## 2020-05-04 NOTE — Progress Notes (Signed)
05/04/2020     CHIEF COMPLAINT Patient presents for Retina Follow Up   HISTORY OF PRESENT ILLNESS: Alexandria Morris is a 84 y.o. female who presents to the clinic today for:   HPI    Retina Follow Up    Patient presents with  Dry AMD.  In both eyes.  Severity is moderate.  Duration of 1 year.  Since onset it is stable.  I, the attending physician,  performed the HPI with the patient and updated documentation appropriately.          Comments    2 Year AMD f\u OU. OCT  Pt feels vision has slightly declined. Pt sees occasional floaters.       Last edited by Elyse Jarvis on 05/04/2020  1:56 PM. (History)      Referring physician: Agustina Caroli, MD No address on file  HISTORICAL INFORMATION:   Selected notes from the MEDICAL RECORD NUMBER       CURRENT MEDICATIONS: No current outpatient medications on file. (Ophthalmic Drugs)   No current facility-administered medications for this visit. (Ophthalmic Drugs)   Current Outpatient Medications (Other)  Medication Sig  . acetaminophen (TYLENOL) 500 MG tablet Take 500 mg by mouth every 4 (four) hours as needed.  Marland Kitchen albuterol (PROAIR HFA) 108 (90 BASE) MCG/ACT inhaler Inhale 2 puffs into the lungs every 4 (four) hours as needed for wheezing or shortness of breath.  . Cholecalciferol (VITAMIN D) 400 UNITS capsule Take 1 capsule by mouth daily.  . clonazePAM (KLONOPIN) 0.5 MG tablet Take 0.5-1 mg by mouth 3 (three) times daily as needed.   . diltiazem (CARDIZEM CD) 240 MG 24 hr capsule Take 240 mg by mouth daily.  . fish oil-omega-3 fatty acids 1000 MG capsule Take 1 g by mouth daily.  . Multiple Vitamins-Minerals (PRESERVISION AREDS 2) CAPS Take 1 capsule by mouth 2 (two) times daily.  Marland Kitchen olmesartan (BENICAR) 20 MG tablet Take 20 mg by mouth daily.  Marland Kitchen omeprazole (PRILOSEC) 40 MG capsule Take 20 mg by mouth daily.   . QUEtiapine (SEROQUEL) 25 MG tablet Take 25 mg by mouth at bedtime.   . sodium chloride (OCEAN) 0.65 %  SOLN nasal spray Place 1 spray into both nostrils as needed for congestion.  . SYMBICORT 80-4.5 MCG/ACT inhaler INHALE 2 PUFFS INTO THE LUNGS 2 (TWO) TIMES DAILY.  . SYMBICORT 80-4.5 MCG/ACT inhaler INHALE 2 PUFFS INTO THE LUNGS 2 (TWO) TIMES DAILY.  . Triamcinolone Acetonide (NASACORT ALLERGY 24HR NA) Place into the nose.  . venlafaxine XR (EFFEXOR XR) 37.5 MG 24 hr capsule Take 1 capsule by mouth daily.  Marland Kitchen warfarin (COUMADIN) 5 MG tablet Take 1 tablet by mouth as directed.   No current facility-administered medications for this visit. (Other)      REVIEW OF SYSTEMS:    ALLERGIES Allergies  Allergen Reactions  . Metoprolol Tartrate Itching    And nightmares  . Albuterol     hyper  . Bentyl [Dicyclomine Hcl]     Does not remember  . Brimonidine   . Cetirizine     Other reaction(s): DRYNESS  . Claritin [Loratadine]     Could not breathe good  . Fexofenadine Other (See Comments)    Bad after taste  . Haldol Decanoate [Haloperidol Decanoate]     Felt weird  . Lovaza [Omega-3-Acid Ethyl Esters]     Fish taste  . Pravastatin   . Simvastatin   . Triavil [Perphenazine-Amitriptyline]     weird  PAST MEDICAL HISTORY Past Medical History:  Diagnosis Date  . Acute bronchitis   . Acute sinusitis   . Asthma   . Ataxia   . Cellulitis   . Chest pain   . COPD (chronic obstructive pulmonary disease) ([place])   . Depression   . Dermatitis   . HTN (hypertension)   . Hypercholesteremia   . Hyperglycemia   . Hypertension   . IBS (irritable bowel syndrome)   . Incontinence of urine   . Insomnia   . Microalbuminuria   . OCD (obsessive compulsive disorder)   . Osteoarthritis   . Osteopenia   . Osteoporosis   . Reflux    Past Surgical History:  Procedure Laterality Date  . BLADDER SURGERY    . CATARACT EXTRACTION W/PHACO Bilateral   . HERNIA REPAIR      FAMILY HISTORY Family History  Problem Relation Age of Onset  . Heart disease Other   . Breast cancer Other   .  Coronary artery disease Other   . Depression Other   . Diabetes Other   . Hyperlipidemia Other   . Hypertension Other   . Osteoporosis Other   . Kidney disease Other   . Lung disease Other   . Stroke Other     SOCIAL HISTORY Social History   Tobacco Use  . Smoking status: Never Smoker  . Smokeless tobacco: Never Used  Substance Use Topics  . Alcohol use: No  . Drug use: No         OPHTHALMIC EXAM:  Base Eye Exam    Visual Acuity (Snellen - Linear)      Right Left   Dist cc E Card @ 3' 20/25 -1       Tonometry (Tonopen, 2:01 PM)      Right Left   Pressure 15 16       Pupils      Pupils Dark Light Shape React APD   Right PERRL 3 3 Round Minimal None   Left PERRL 3 3 Round Minimal None       Visual Fields (Counting fingers)      Left Right    Full Full       Neuro/Psych    Oriented x3: Yes   Mood/Affect: Normal       Dilation    Both eyes: 1.0% Mydriacyl, 2.5% Phenylephrine @ 2:01 PM        Slit Lamp and Fundus Exam    External Exam      Right Left   External Normal Normal       Slit Lamp Exam      Right Left   Lids/Lashes Normal Normal   Conjunctiva/Sclera White and quiet White and quiet   Cornea Clear Clear   Anterior Chamber Deep and quiet Deep and quiet   Iris Round and reactive Round and reactive   Lens Posterior chamber intraocular lens Posterior chamber intraocular lens   Anterior Vitreous Normal Normal       Fundus Exam      Right Left   Posterior Vitreous Normal Posterior vitreous detachment   Disc Normal Normal   C/D Ratio 0.4    Macula Geographic atrophy, Hemorrhage, inferiorly Soft drusen, no macular thickening, no hemorrhage, no exudates   Vessels Normal Normal   Periphery Normal Normal          IMAGING AND PROCEDURES  Imaging and Procedures for 05/04/20  OCT, Retina - OU - Both Eyes  Right Eye Quality was good. Scan locations included subfoveal. Central Foveal Thickness: 225. Progression has worsened.  Findings include abnormal foveal contour, retinal drusen , outer retinal atrophy, central retinal atrophy, cystoid macular edema.   Left Eye Quality was good. Scan locations included subfoveal. Central Foveal Thickness: 278. Progression has been stable. Findings include retinal drusen .   Notes New onset CME juxta foveal inferior temporal OD with threatened enlargement of scotoma  OS no signs of CN VM       Intravitreal Injection, Pharmacologic Agent - OD - Right Eye       Time Out 05/04/2020. 3:18 PM. Confirmed correct patient, procedure, site, and patient consented.   Anesthesia Topical anesthesia was used. Anesthetic medications included Akten 3.5%.   Procedure Preparation included Tobramycin 0.3%, Ofloxacin . A 30 gauge needle was used.   Injection:  1.25 mg Bevacizumab (AVASTIN) SOLN   NDC: [address]-769194-0334-1, Lot: [id number]   Route: Intravitreal, Site: Right Eye, Waste: 0 mg  Post-op Post injection exam found visual acuity of at least counting fingers. The patient tolerated the procedure well. There were no complications. The patient received written and verbal post procedure care education. Post injection medications were not given.                 ASSESSMENT/PLAN:  Exudative age-related macular degeneration of right eye with active choroidal neovascularization (HCC) The nature of wet macular degeneration was discussed with the patient.  Forms of therapy reviewed include the use of Anti-VEGF medications injected painlessly into the eye, as well as other possible treatment modalities, including thermal laser therapy. Fellow eye involvement and risks were discussed with the patient. Upon the finding of wet age related macular degeneration, treatment will be offered. The treatment regimen is on a treat as needed basis with the intent to treat if necessary and extend interval of exams when possible. On average 1 out of 6 patients do not need lifetime therapy. However, the risk of  recurrent disease is high for a lifetime.  Initially monthly, then periodic, examinations and evaluations will determine whether the next treatment is required on the day of the examination.  OD with no hemorrhage and new juxta foveal CME with CME as well.  Commence with intravitreal Avastin to limit scotoma  Advanced nonexudative age-related macular degeneration of right eye with subfoveal involvement Central atrophy of the RPE accounts for visual acuity      ICD-10-CM   1. Exudative age-related macular degeneration of right eye with active choroidal neovascularization (HCC)  H35.3211 Intravitreal Injection, Pharmacologic Agent - OD - Right Eye    Bevacizumab (AVASTIN) SOLN 1.25 mg    CANCELED: Intravitreal Injection, Pharmacologic Agent - OS - Left Eye  2. Advanced nonexudative age-related macular degeneration of right eye with subfoveal involvement  H35.3114 OCT, Retina - OU - Both Eyes  3. Degenerative retinal drusen of both eyes  H35.363 OCT, Retina - OU - Both Eyes  4. Intermediate stage nonexudative age-related macular degeneration of left eye  H35.3122   5. Right epiretinal membrane  H35.371   6. Retinal hemorrhage of right eye  H35.61 CANCELED: Intravitreal Injection, Pharmacologic Agent - OS - Left Eye    1.  2.  3.  Ophthalmic Meds Ordered this visit:  Meds ordered this encounter  Medications  . Bevacizumab (AVASTIN) SOLN 1.25 mg       Return in about 5 weeks (around 06/08/2020) for dilate, OD, AVASTIN OCT.  Patient Instructions  Age-Related Macular Degeneration  Age-related macular degeneration (  AMD) is an eye disease related to aging. The disease causes a loss of central vision. Central vision allows a person to see objects clearly and do daily tasks like reading and driving. There are two main types of AMD:  Dry AMD. People with this type generally lose their vision slowly. This is the most common type of AMD. Some people with dry AMD notice very little change in  their vision as they age.  Wet AMD. People with this type can lose their vision quickly. What are the causes? This condition is caused by damage to the part of the eye that provides you with central vision (macula).  Dry AMD happens when deposits in the macula cause light-sensitive cells to slowly break down.  Wet AMD happens when abnormal blood vessels grow under the macula and leak blood and fluid. What increases the risk? You are more likely to develop this condition if you:  Are 50 years old or older, and especially 88 years old or older.  Smoke.  Are obese.  Have a family history of AMD.  Have high cholesterol, high blood pressure, or heart disease.  Have been exposed to high levels of ultraviolet (UV) light and blue light.  Are white (Caucasian).  Are female. What are the signs or symptoms? Common symptoms of this condition include:  Blurred vision, especially when reading print material. The blurred vision often improves in brighter light.  A blurred or blind spot in the center of your field of vision that is small but growing larger.  Bright colors seeming less bright than they used to be.  Decreased ability to recognize and see faces.  One eye seeing worse than the other.  Decreased ability to adapt to dimly lit rooms.  Straight lines appearing crooked or wavy. How is this diagnosed? This condition is diagnosed based on your symptoms and an eye exam. During the eye exam:  Eye drops will be placed into your eyes to enlarge (dilate) your pupils. This will allow your health care provider to see the back of your eye.  You may be asked to look at an image that looks like a checkerboard (Amsler grid). Early changes in your central vision may cause the grid to appear distorted. After the exam, you may be given one or both of these tests:  Fluorescein angiogram. This test determines whether you have dry or wet AMD.  Optical coherence tomography (OCT) test to  evaluate deep layers of the retina. How is this treated? There is no cure for this condition, but treatment can help to slow down progression of the disease. This condition may be treated with:  Supplements, including vitamin C, vitamin E, beta carotene, and zinc.  Laser surgery to destroy new blood vessels or leaking blood vessels in your eye.  Injections of medicines into your eye to slow down the formation of abnormal blood vessels that may leak. These injections may need to be repeated on a routine basis. Follow these instructions at home:  Take over-the-counter and prescription medicines only as told by your health care provider.  Take vitamins and supplements as told by your health care provider.  Ask your health care provider for an Amsler grid. Use it every day to check each eye for vision changes.  Get an eye exam as often as told by your health care provider. Make sure to get an eye exam at least once every year.  Keep all follow-up visits as told by your health care provider. This is  important. Contact a health care provider if:  You notice any new changes in your vision. Get help right away if:  You suddenly lose vision or develop pain in the eye. Summary  Age-related macular degeneration (AMD) is an eye disease related to aging. There are two types of this condition: dry AMD and wet AMD.  This condition is caused by damage to the part of the eye that provides you with central vision (macula).  Once diagnosed with AMD, make sure to get an eye exam every year, take supplements and vitamins as directed, use an Amsler grid at home, and follow up with your health care provider. This information is not intended to replace advice given to you by your health care provider. Make sure you discuss any questions you have with your health care provider. Document Revised: 06/13/2018 Document Reviewed: 06/13/2018 Elsevier Patient Education  2020 [employer].     Explained the  diagnoses, plan, and follow up with the patient and they expressed understanding.  Patient expressed understanding of the importance of proper follow up care.   Alford Highland Aubryana Vittorio M.D. Diseases & Surgery of the Retina and Vitreous [facility] 05/04/20     Abbreviations: M myopia (nearsighted); A astigmatism; H hyperopia (farsighted); P presbyopia; Mrx spectacle prescription;  CTL contact lenses; OD right eye; OS left eye; OU both eyes  XT exotropia; ET esotropia; PEK punctate epithelial keratitis; PEE punctate epithelial erosions; DES dry eye syndrome; MGD meibomian gland dysfunction; ATs artificial tears; PFAT's preservative free artificial tears; NSC nuclear sclerotic cataract; PSC posterior subcapsular cataract; ERM epi-retinal membrane; PVD posterior vitreous detachment; RD retinal detachment; DM diabetes mellitus; DR diabetic retinopathy; NPDR non-proliferative diabetic retinopathy; PDR proliferative diabetic retinopathy; CSME clinically significant macular edema; DME diabetic macular edema; dbh dot blot hemorrhages; CWS cotton wool spot; POAG primary open angle glaucoma; C/D cup-to-disc ratio; HVF humphrey visual field; GVF goldmann visual field; OCT optical coherence tomography; IOP intraocular pressure; BRVO Branch retinal vein occlusion; CRVO central retinal vein occlusion; CRAO central retinal artery occlusion; BRAO branch retinal artery occlusion; RT retinal tear; SB scleral buckle; PPV pars plana vitrectomy; VH Vitreous hemorrhage; PRP panretinal laser photocoagulation; IVK intravitreal kenalog; VMT vitreomacular traction; MH Macular hole;  NVD neovascularization of the disc; NVE neovascularization elsewhere; AREDS age related eye disease study; ARMD age related macular degeneration; POAG primary open angle glaucoma; EBMD epithelial/anterior basement membrane dystrophy; ACIOL anterior chamber intraocular lens; IOL intraocular lens; PCIOL posterior chamber intraocular lens;  Phaco/IOL phacoemulsification with intraocular lens placement; PRK photorefractive keratectomy; LASIK laser assisted in situ keratomileusis; HTN hypertension; DM diabetes mellitus; COPD chronic obstructive pulmonary disease

## 2020-05-04 NOTE — Patient Instructions (Signed)
Age-Related Macular Degeneration  Age-related macular degeneration (AMD) is an eye disease related to aging. The disease causes a loss of central vision. Central vision allows a person to see objects clearly and do daily tasks like reading and driving. There are two main types of AMD:  Dry AMD. People with this type generally lose their vision slowly. This is the most common type of AMD. Some people with dry AMD notice very little change in their vision as they age.  Wet AMD. People with this type can lose their vision quickly. What are the causes? This condition is caused by damage to the part of the eye that provides you with central vision (macula).  Dry AMD happens when deposits in the macula cause light-sensitive cells to slowly break down.  Wet AMD happens when abnormal blood vessels grow under the macula and leak blood and fluid. What increases the risk? You are more likely to develop this condition if you:  Are 50 years old or older, and especially 75 years old or older.  Smoke.  Are obese.  Have a family history of AMD.  Have high cholesterol, high blood pressure, or heart disease.  Have been exposed to high levels of ultraviolet (UV) light and blue light.  Are white (Caucasian).  Are female. What are the signs or symptoms? Common symptoms of this condition include:  Blurred vision, especially when reading print material. The blurred vision often improves in brighter light.  A blurred or blind spot in the center of your field of vision that is small but growing larger.  Bright colors seeming less bright than they used to be.  Decreased ability to recognize and see faces.  One eye seeing worse than the other.  Decreased ability to adapt to dimly lit rooms.  Straight lines appearing crooked or wavy. How is this diagnosed? This condition is diagnosed based on your symptoms and an eye exam. During the eye exam:  Eye drops will be placed into your eyes to  enlarge (dilate) your pupils. This will allow your health care provider to see the back of your eye.  You may be asked to look at an image that looks like a checkerboard (Amsler grid). Early changes in your central vision may cause the grid to appear distorted. After the exam, you may be given one or both of these tests:  Fluorescein angiogram. This test determines whether you have dry or wet AMD.  Optical coherence tomography (OCT) test to evaluate deep layers of the retina. How is this treated? There is no cure for this condition, but treatment can help to slow down progression of the disease. This condition may be treated with:  Supplements, including vitamin C, vitamin E, beta carotene, and zinc.  Laser surgery to destroy new blood vessels or leaking blood vessels in your eye.  Injections of medicines into your eye to slow down the formation of abnormal blood vessels that may leak. These injections may need to be repeated on a routine basis. Follow these instructions at home:  Take over-the-counter and prescription medicines only as told by your health care provider.  Take vitamins and supplements as told by your health care provider.  Ask your health care provider for an Amsler grid. Use it every day to check each eye for vision changes.  Get an eye exam as often as told by your health care provider. Make sure to get an eye exam at least once every year.  Keep all follow-up visits as told by   your health care provider. This is important. Contact a health care provider if:  You notice any new changes in your vision. Get help right away if:  You suddenly lose vision or develop pain in the eye. Summary  Age-related macular degeneration (AMD) is an eye disease related to aging. There are two types of this condition: dry AMD and wet AMD.  This condition is caused by damage to the part of the eye that provides you with central vision (macula).  Once diagnosed with AMD, make sure  to get an eye exam every year, take supplements and vitamins as directed, use an Amsler grid at home, and follow up with your health care provider. This information is not intended to replace advice given to you by your health care provider. Make sure you discuss any questions you have with your health care provider. Document Revised: 06/13/2018 Document Reviewed: 06/13/2018 Elsevier Patient Education  2020 [employer]  

## 2020-05-04 NOTE — Assessment & Plan Note (Signed)
Central atrophy of the RPE accounts for visual acuity

## 2020-05-04 NOTE — Assessment & Plan Note (Signed)
The nature of wet macular degeneration was discussed with the patient.  Forms of therapy reviewed include the use of Anti-VEGF medications injected painlessly into the eye, as well as other possible treatment modalities, including thermal laser therapy. Fellow eye involvement and risks were discussed with the patient. Upon the finding of wet age related macular degeneration, treatment will be offered. The treatment regimen is on a treat as needed basis with the intent to treat if necessary and extend interval of exams when possible. On average 1 out of 6 patients do not need lifetime therapy. However, the risk of recurrent disease is high for a lifetime.  Initially monthly, then periodic, examinations and evaluations will determine whether the next treatment is required on the day of the examination.  OD with no hemorrhage and new juxta foveal CME with CME as well.  Commence with intravitreal Avastin to limit scotoma

## 2020-06-08 ENCOUNTER — Other Ambulatory Visit: Payer: Self-pay

## 2020-06-08 ENCOUNTER — Ambulatory Visit (INDEPENDENT_AMBULATORY_CARE_PROVIDER_SITE_OTHER): Payer: 59 | Admitting: Ophthalmology

## 2020-06-08 ENCOUNTER — Encounter (INDEPENDENT_AMBULATORY_CARE_PROVIDER_SITE_OTHER): Payer: Self-pay | Admitting: Ophthalmology

## 2020-06-08 DIAGNOSIS — H353211 Exudative age-related macular degeneration, right eye, with active choroidal neovascularization: Secondary | ICD-10-CM | POA: Diagnosis not present

## 2020-06-08 DIAGNOSIS — H353114 Nonexudative age-related macular degeneration, right eye, advanced atrophic with subfoveal involvement: Secondary | ICD-10-CM | POA: Diagnosis not present

## 2020-06-08 MED ORDER — BEVACIZUMAB CHEMO INJECTION 1.25MG/0.05ML SYRINGE FOR KALEIDOSCOPE
1.2500 mg | INTRAVITREAL | Status: AC | PRN
  Administered 2020-06-08: 1.25 mg via INTRAVITREAL

## 2020-06-08 NOTE — Progress Notes (Signed)
06/08/2020     CHIEF COMPLAINT Patient presents for Retina Follow Up   HISTORY OF PRESENT ILLNESS: Alexandria Morris is a 84 y.o. female who presents to the clinic today for:   HPI    Retina Follow Up    Patient presents with  Wet AMD.  In right eye.  Duration of 5 weeks.  Since onset it is stable.          Comments    5 week follow up - OCT OU, Poss Avastin OD,   with no change in visual acuity since then in the left eye remained stable Patient denies change in vision and overall has no complaints.        Last edited by Edmon Crape, MD on 06/08/2020  3:09 PM. (History)      Referring physician: Agustina Caroli, MD No address on file  HISTORICAL INFORMATION:   Selected notes from the MEDICAL RECORD NUMBER       CURRENT MEDICATIONS: No current outpatient medications on file. (Ophthalmic Drugs)   No current facility-administered medications for this visit. (Ophthalmic Drugs)   Current Outpatient Medications (Other)  Medication Sig  . acetaminophen (TYLENOL) 500 MG tablet Take 500 mg by mouth every 4 (four) hours as needed.  Marland Kitchen albuterol (PROAIR HFA) 108 (90 BASE) MCG/ACT inhaler Inhale 2 puffs into the lungs every 4 (four) hours as needed for wheezing or shortness of breath.  . Cholecalciferol (VITAMIN D) 400 UNITS capsule Take 1 capsule by mouth daily.  . clonazePAM (KLONOPIN) 0.5 MG tablet Take 0.5-1 mg by mouth 3 (three) times daily as needed.   . diltiazem (CARDIZEM CD) 240 MG 24 hr capsule Take 240 mg by mouth daily.  . fish oil-omega-3 fatty acids 1000 MG capsule Take 1 g by mouth daily.  . Multiple Vitamins-Minerals (PRESERVISION AREDS 2) CAPS Take 1 capsule by mouth 2 (two) times daily.  Marland Kitchen olmesartan (BENICAR) 20 MG tablet Take 20 mg by mouth daily.  Marland Kitchen omeprazole (PRILOSEC) 40 MG capsule Take 20 mg by mouth daily.   . QUEtiapine (SEROQUEL) 25 MG tablet Take 25 mg by mouth at bedtime.   . sodium chloride (OCEAN) 0.65 % SOLN nasal spray Place 1 spray into  both nostrils as needed for congestion.  . SYMBICORT 80-4.5 MCG/ACT inhaler INHALE 2 PUFFS INTO THE LUNGS 2 (TWO) TIMES DAILY.  . SYMBICORT 80-4.5 MCG/ACT inhaler INHALE 2 PUFFS INTO THE LUNGS 2 (TWO) TIMES DAILY.  . Triamcinolone Acetonide (NASACORT ALLERGY 24HR NA) Place into the nose.  . venlafaxine XR (EFFEXOR XR) 37.5 MG 24 hr capsule Take 1 capsule by mouth daily.  Marland Kitchen warfarin (COUMADIN) 5 MG tablet Take 1 tablet by mouth as directed.   No current facility-administered medications for this visit. (Other)      REVIEW OF SYSTEMS:    ALLERGIES Allergies  Allergen Reactions  . Metoprolol Tartrate Itching    And nightmares  . Albuterol     hyper  . Bentyl [Dicyclomine Hcl]     Does not remember  . Brimonidine   . Cetirizine     Other reaction(s): DRYNESS  . Claritin [Loratadine]     Could not breathe good  . Fexofenadine Other (See Comments)    Bad after taste  . Haldol Decanoate [Haloperidol Decanoate]     Felt weird  . Lovaza [Omega-3-Acid Ethyl Esters]     Fish taste  . Pravastatin   . Simvastatin   . Triavil [Perphenazine-Amitriptyline]  weird    PAST MEDICAL HISTORY Past Medical History:  Diagnosis Date  . Acute bronchitis   . Acute sinusitis   . Asthma   . Ataxia   . Cellulitis   . Chest pain   . COPD (chronic obstructive pulmonary disease) ([place])   . Depression   . Dermatitis   . HTN (hypertension)   . Hypercholesteremia   . Hyperglycemia   . Hypertension   . IBS (irritable bowel syndrome)   . Incontinence of urine   . Insomnia   . Microalbuminuria   . OCD (obsessive compulsive disorder)   . Osteoarthritis   . Osteopenia   . Osteoporosis   . Reflux    Past Surgical History:  Procedure Laterality Date  . BLADDER SURGERY    . CATARACT EXTRACTION W/PHACO Bilateral   . HERNIA REPAIR      FAMILY HISTORY Family History  Problem Relation Age of Onset  . Heart disease Other   . Breast cancer Other   . Coronary artery disease Other   .  Depression Other   . Diabetes Other   . Hyperlipidemia Other   . Hypertension Other   . Osteoporosis Other   . Kidney disease Other   . Lung disease Other   . Stroke Other     SOCIAL HISTORY Social History   Tobacco Use  . Smoking status: Never Smoker  . Smokeless tobacco: Never Used  Substance Use Topics  . Alcohol use: No  . Drug use: No         OPHTHALMIC EXAM:  Base Eye Exam    Visual Acuity (Snellen - Linear)      Right Left   Dist cc CF @ 7' 20/25+1   Dist ph cc NI        Tonometry (Tonopen, 1:57 PM)      Right Left   Pressure 16 17       Pupils      Pupils Dark Light Shape React APD   Right PERRL 3 3 Round Minimal None   Left PERRL 3 3 Round Minimal None       Visual Fields (Counting fingers)      Left Right    Full Full       Extraocular Movement      Right Left    Full Full       Neuro/Psych    Oriented x3: Yes   Mood/Affect: Normal        Slit Lamp and Fundus Exam    External Exam      Right Left   External Normal Normal       Slit Lamp Exam      Right Left   Lids/Lashes Normal Normal   Conjunctiva/Sclera White and quiet White and quiet   Cornea Clear Clear   Anterior Chamber Deep and quiet Deep and quiet   Iris Round and reactive Round and reactive   Lens Posterior chamber intraocular lens Posterior chamber intraocular lens   Anterior Vitreous Normal Normal       Fundus Exam      Right Left   Posterior Vitreous Normal    Disc Normal    C/D Ratio 0.4    Macula Geographic atrophy, Hemorrhage, inferiorly Geographic atrophy, Hemorrhage, Macular thickening on the inferior edge of the central foveal RPE atrophy   Vessels Normal    Periphery Normal           IMAGING AND PROCEDURES  Imaging and Procedures for  06/08/20  OCT, Retina - OU - Both Eyes       Right Eye Quality was good. Scan locations included subfoveal. Central Foveal Thickness: 272. Findings include subretinal scarring, subretinal fluid, intraretinal  fluid.   Left Eye Quality was good. Scan locations included subfoveal. Central Foveal Thickness: 278. Progression has been stable. Findings include retinal drusen .   Notes OD active lesion on the inferior and temporal aspect of the fovea in the region of atrophy.  5-week interval today.  We will repeat intravitreal Avastin OD today.                ASSESSMENT/PLAN:  Advanced nonexudative age-related macular degeneration of right eye with subfoveal involvement Chronically active lesion in the perifoveal area OD.  Still active at 5-week examination after intravitreal Avastin.  Repeat today and exam in 5 weeks      ICD-10-CM   1. Exudative age-related macular degeneration of right eye with active choroidal neovascularization (HCC)  H35.3211 OCT, Retina - OU - Both Eyes  2. Advanced nonexudative age-related macular degeneration of right eye with subfoveal involvement  H35.3114     1.  2.  3.  Ophthalmic Meds Ordered this visit:  No orders of the defined types were placed in this encounter.      Return in about 5 weeks (around 07/13/2020) for dilate, OD, AVASTIN OCT.  There are no Patient Instructions on file for this visit.   Explained the diagnoses, plan, and follow up with the patient and they expressed understanding.  Patient expressed understanding of the importance of proper follow up care.   Alford Highland Audrena Talaga M.D. Diseases & Surgery of the Retina and Vitreous [facility] 06/08/20     Abbreviations: M myopia (nearsighted); A astigmatism; H hyperopia (farsighted); P presbyopia; Mrx spectacle prescription;  CTL contact lenses; OD right eye; OS left eye; OU both eyes  XT exotropia; ET esotropia; PEK punctate epithelial keratitis; PEE punctate epithelial erosions; DES dry eye syndrome; MGD meibomian gland dysfunction; ATs artificial tears; PFAT's preservative free artificial tears; NSC nuclear sclerotic cataract; PSC posterior subcapsular cataract; ERM  epi-retinal membrane; PVD posterior vitreous detachment; RD retinal detachment; DM diabetes mellitus; DR diabetic retinopathy; NPDR non-proliferative diabetic retinopathy; PDR proliferative diabetic retinopathy; CSME clinically significant macular edema; DME diabetic macular edema; dbh dot blot hemorrhages; CWS cotton wool spot; POAG primary open angle glaucoma; C/D cup-to-disc ratio; HVF humphrey visual field; GVF goldmann visual field; OCT optical coherence tomography; IOP intraocular pressure; BRVO Branch retinal vein occlusion; CRVO central retinal vein occlusion; CRAO central retinal artery occlusion; BRAO branch retinal artery occlusion; RT retinal tear; SB scleral buckle; PPV pars plana vitrectomy; VH Vitreous hemorrhage; PRP panretinal laser photocoagulation; IVK intravitreal kenalog; VMT vitreomacular traction; MH Macular hole;  NVD neovascularization of the disc; NVE neovascularization elsewhere; AREDS age related eye disease study; ARMD age related macular degeneration; POAG primary open angle glaucoma; EBMD epithelial/anterior basement membrane dystrophy; ACIOL anterior chamber intraocular lens; IOL intraocular lens; PCIOL posterior chamber intraocular lens; Phaco/IOL phacoemulsification with intraocular lens placement; PRK photorefractive keratectomy; LASIK laser assisted in situ keratomileusis; HTN hypertension; DM diabetes mellitus; COPD chronic obstructive pulmonary disease

## 2020-06-08 NOTE — Assessment & Plan Note (Signed)
Chronically active lesion in the perifoveal area OD.  Still active at 5-week examination after intravitreal Avastin.  Repeat today and exam in 5 weeks

## 2020-07-13 ENCOUNTER — Other Ambulatory Visit: Payer: Self-pay

## 2020-07-13 ENCOUNTER — Encounter (INDEPENDENT_AMBULATORY_CARE_PROVIDER_SITE_OTHER): Payer: Self-pay | Admitting: Ophthalmology

## 2020-07-13 ENCOUNTER — Ambulatory Visit (INDEPENDENT_AMBULATORY_CARE_PROVIDER_SITE_OTHER): Payer: 59 | Admitting: Ophthalmology

## 2020-07-13 DIAGNOSIS — H353211 Exudative age-related macular degeneration, right eye, with active choroidal neovascularization: Secondary | ICD-10-CM | POA: Diagnosis not present

## 2020-07-13 DIAGNOSIS — H353114 Nonexudative age-related macular degeneration, right eye, advanced atrophic with subfoveal involvement: Secondary | ICD-10-CM | POA: Diagnosis not present

## 2020-07-13 MED ORDER — BEVACIZUMAB CHEMO INJECTION 1.25MG/0.05ML SYRINGE FOR KALEIDOSCOPE
1.2500 mg | INTRAVITREAL | Status: AC | PRN
  Administered 2020-07-13: 1.25 mg via INTRAVITREAL

## 2020-07-13 NOTE — Progress Notes (Signed)
07/13/2020     CHIEF COMPLAINT Patient presents for Retina Follow Up   HISTORY OF PRESENT ILLNESS: Alexandria Morris is a 84 y.o. female who presents to the clinic today for:   HPI    Retina Follow Up    Patient presents with  Wet AMD.  In right eye.  This started 5 weeks ago.  Severity is mild.  Duration of 5 weeks.  Since onset it is stable.          Comments    5 Week AMD F/U OD, poss Avastin OD  Pt denies noticeable changes to [place] OU since last visit. Pt denies ocular pain, flashes of light, or floaters OU.         Last edited by Ileana Roup, COA on 07/13/2020  2:43 PM. (History)      Referring physician: Agustina Caroli, MD No address on file  HISTORICAL INFORMATION:   Selected notes from the MEDICAL RECORD NUMBER       CURRENT MEDICATIONS: No current outpatient medications on file. (Ophthalmic Drugs)   No current facility-administered medications for this visit. (Ophthalmic Drugs)   Current Outpatient Medications (Other)  Medication Sig  . acetaminophen (TYLENOL) 500 MG tablet Take 500 mg by mouth every 4 (four) hours as needed.  Marland Kitchen albuterol (PROAIR HFA) 108 (90 BASE) MCG/ACT inhaler Inhale 2 puffs into the lungs every 4 (four) hours as needed for wheezing or shortness of breath.  . Cholecalciferol (VITAMIN D) 400 UNITS capsule Take 1 capsule by mouth daily.  . clonazePAM (KLONOPIN) 0.5 MG tablet Take 0.5-1 mg by mouth 3 (three) times daily as needed.   . diltiazem (CARDIZEM CD) 240 MG 24 hr capsule Take 240 mg by mouth daily.  . fish oil-omega-3 fatty acids 1000 MG capsule Take 1 g by mouth daily.  . Multiple Vitamins-Minerals (PRESERVISION AREDS 2) CAPS Take 1 capsule by mouth 2 (two) times daily.  Marland Kitchen olmesartan (BENICAR) 20 MG tablet Take 20 mg by mouth daily.  Marland Kitchen omeprazole (PRILOSEC) 40 MG capsule Take 20 mg by mouth daily.   . QUEtiapine (SEROQUEL) 25 MG tablet Take 25 mg by mouth at bedtime.   . sodium chloride (OCEAN) 0.65 % SOLN nasal spray  Place 1 spray into both nostrils as needed for congestion.  . SYMBICORT 80-4.5 MCG/ACT inhaler INHALE 2 PUFFS INTO THE LUNGS 2 (TWO) TIMES DAILY.  . SYMBICORT 80-4.5 MCG/ACT inhaler INHALE 2 PUFFS INTO THE LUNGS 2 (TWO) TIMES DAILY.  . Triamcinolone Acetonide (NASACORT ALLERGY 24HR NA) Place into the nose.  . venlafaxine XR (EFFEXOR XR) 37.5 MG 24 hr capsule Take 1 capsule by mouth daily.  Marland Kitchen warfarin (COUMADIN) 5 MG tablet Take 1 tablet by mouth as directed.   No current facility-administered medications for this visit. (Other)      REVIEW OF SYSTEMS:    ALLERGIES Allergies  Allergen Reactions  . Metoprolol Tartrate Itching    And nightmares  . Albuterol     hyper  . Bentyl [Dicyclomine Hcl]     Does not remember  . Brimonidine   . Cetirizine     Other reaction(s): DRYNESS  . Claritin [Loratadine]     Could not breathe good  . Fexofenadine Other (See Comments)    Bad after taste  . Haldol Decanoate [Haloperidol Decanoate]     Felt weird  . Lovaza [Omega-3-Acid Ethyl Esters]     Fish taste  . Pravastatin   . Simvastatin   . Triavil [Perphenazine-Amitriptyline]  weird    PAST MEDICAL HISTORY Past Medical History:  Diagnosis Date  . Acute bronchitis   . Acute sinusitis   . Asthma   . Ataxia   . Cellulitis   . Chest pain   . COPD (chronic obstructive pulmonary disease) (HCC)   . Depression   . Dermatitis   . HTN (hypertension)   . Hypercholesteremia   . Hyperglycemia   . Hypertension   . IBS (irritable bowel syndrome)   . Incontinence of urine   . Insomnia   . Microalbuminuria   . OCD (obsessive compulsive disorder)   . Osteoarthritis   . Osteopenia   . Osteoporosis   . Reflux    Past Surgical History:  Procedure Laterality Date  . BLADDER SURGERY    . CATARACT EXTRACTION W/PHACO Bilateral   . HERNIA REPAIR      FAMILY HISTORY Family History  Problem Relation Age of Onset  . Heart disease Other   . Breast cancer Other   . Coronary artery  disease Other   . Depression Other   . Diabetes Other   . Hyperlipidemia Other   . Hypertension Other   . Osteoporosis Other   . Kidney disease Other   . Lung disease Other   . Stroke Other     SOCIAL HISTORY Social History   Tobacco Use  . Smoking status: Never Smoker  . Smokeless tobacco: Never Used  Substance Use Topics  . Alcohol use: No  . Drug use: No         OPHTHALMIC EXAM:  Base Eye Exam    Visual Acuity (ETDRS)      Right Left   Dist cc CF @ 6' 20/25 -2   Dist ph cc 20/400    Correction: Glasses       Tonometry (Tonopen, 2:49 PM)      Right Left   Pressure 18 19       Pupils      Pupils Dark Light Shape React APD   Right PERRL 3 3 Round Minimal None   Left PERRL 3 3 Round Minimal None       Visual Fields (Counting fingers)      Left Right    Full Full       Extraocular Movement      Right Left    Full Full       Neuro/Psych    Oriented x3: Yes   Mood/Affect: Normal       Dilation    Right eye: 1.0% Mydriacyl, 2.5% Phenylephrine @ 2:49 PM        Slit Lamp and Fundus Exam    External Exam      Right Left   External Normal Normal       Slit Lamp Exam      Right Left   Lids/Lashes Normal Normal   Conjunctiva/Sclera White and quiet White and quiet   Cornea Clear Clear   Anterior Chamber Deep and quiet Deep and quiet   Iris Round and reactive Round and reactive   Lens Posterior chamber intraocular lens Posterior chamber intraocular lens   Anterior Vitreous Normal Normal       Fundus Exam      Right Left   Posterior Vitreous Normal    Disc Normal    C/D Ratio 0.4    Macula Geographic atrophy in the FAZ, Hemorrhage, inferiorly, Macular thickening probably    Vessels Normal    Periphery Normal  IMAGING AND PROCEDURES  Imaging and Procedures for 07/13/20  OCT, Retina - OU - Both Eyes       Right Eye Quality was good. Scan locations included subfoveal. Central Foveal Thickness: 274. Progression has  improved. Findings include abnormal foveal contour, retinal drusen , choroidal neovascular membrane, cystoid macular edema.   Left Eye Quality was good. Scan locations included subfoveal. Central Foveal Thickness: 279. Progression has been stable. Findings include no SRF, retinal drusen .        Intravitreal Injection, Pharmacologic Agent - OD - Right Eye       Time Out 07/13/2020. 4:06 PM. Confirmed correct patient, procedure, site, and patient consented.   Anesthesia Topical anesthesia was used. Anesthetic medications included Akten 3.5%.   Procedure Preparation included Ofloxacin , Tobramycin 0.3%, 10% betadine to eyelids, 5% betadine to ocular surface. A 30 gauge needle was used.   Injection:  1.25 mg Bevacizumab (AVASTIN) SOLN   NDC: [address]-669194-0334-1   Route: Intravitreal, Site: Right Eye, Waste: 0 mg  Post-op Post injection exam found visual acuity of at least counting fingers. The patient tolerated the procedure well. There were no complications. The patient received written and verbal post procedure care education. Post injection medications were not given.                 ASSESSMENT/PLAN:  Advanced nonexudative age-related macular degeneration of right eye with subfoveal involvement The nature of wet macular degeneration was discussed with the patient.  Forms of therapy reviewed include the use of Anti-VEGF medications injected painlessly into the eye, as well as other possible treatment modalities, including thermal laser therapy. Fellow eye involvement and risks were discussed with the patient. Upon the finding of wet age related macular degeneration, treatment will be offered. The treatment regimen is on a treat as needed basis with the intent to treat if necessary and extend interval of exams when possible. On average 1 out of 6 patients do not need lifetime therapy. However, the risk of recurrent disease is high for a lifetime.  Initially monthly, then periodic,  examinations and evaluations will determine whether the next treatment is required on the day of the examination.  We will repeat intravitreal Avastin OD today and examination in 6 weeks repeat intravitreal Avastin today to prevent scotoma enlargement OD, still active temporal to the fovea, atrophy limits the acuity centrally OD      ICD-10-CM   1. Exudative age-related macular degeneration of right eye with active choroidal neovascularization (HCC)  H35.3211 OCT, Retina - OU - Both Eyes    Intravitreal Injection, Pharmacologic Agent - OD - Right Eye    Bevacizumab (AVASTIN) SOLN 1.25 mg  2. Advanced nonexudative age-related macular degeneration of right eye with subfoveal involvement  H35.3114     1.  2.  3.  Ophthalmic Meds Ordered this visit:  Meds ordered this encounter  Medications  . Bevacizumab (AVASTIN) SOLN 1.25 mg       Return in about 6 weeks (around 08/24/2020) for dilate, OD, AVASTIN OCT.  There are no Patient Instructions on file for this visit.   Explained the diagnoses, plan, and follow up with the patient and they expressed understanding.  Patient expressed understanding of the importance of proper follow up care.   Alford HighlandGary A. Antasia Haider M.D. Diseases & Surgery of the Retina and Vitreous [facility] 07/13/20     Abbreviations: M myopia (nearsighted); A astigmatism; H hyperopia (farsighted); P presbyopia; Mrx spectacle prescription;  CTL contact lenses;  OD right eye; OS left eye; OU both eyes  XT exotropia; ET esotropia; PEK punctate epithelial keratitis; PEE punctate epithelial erosions; DES dry eye syndrome; MGD meibomian gland dysfunction; ATs artificial tears; PFAT's preservative free artificial tears; NSC nuclear sclerotic cataract; PSC posterior subcapsular cataract; ERM epi-retinal membrane; PVD posterior vitreous detachment; RD retinal detachment; DM diabetes mellitus; DR diabetic retinopathy; NPDR non-proliferative diabetic retinopathy; PDR  proliferative diabetic retinopathy; CSME clinically significant macular edema; DME diabetic macular edema; dbh dot blot hemorrhages; CWS cotton wool spot; POAG primary open angle glaucoma; C/D cup-to-disc ratio; HVF humphrey visual field; GVF goldmann visual field; OCT optical coherence tomography; IOP intraocular pressure; BRVO Branch retinal vein occlusion; CRVO central retinal vein occlusion; CRAO central retinal artery occlusion; BRAO branch retinal artery occlusion; RT retinal tear; SB scleral buckle; PPV pars plana vitrectomy; VH Vitreous hemorrhage; PRP panretinal laser photocoagulation; IVK intravitreal kenalog; VMT vitreomacular traction; MH Macular hole;  NVD neovascularization of the disc; NVE neovascularization elsewhere; AREDS age related eye disease study; ARMD age related macular degeneration; POAG primary open angle glaucoma; EBMD epithelial/anterior basement membrane dystrophy; ACIOL anterior chamber intraocular lens; IOL intraocular lens; PCIOL posterior chamber intraocular lens; Phaco/IOL phacoemulsification with intraocular lens placement; PRK photorefractive keratectomy; LASIK laser assisted in situ keratomileusis; HTN hypertension; DM diabetes mellitus; COPD chronic obstructive pulmonary disease

## 2020-07-13 NOTE — Assessment & Plan Note (Signed)
The nature of wet macular degeneration was discussed with the patient.  Forms of therapy reviewed include the use of Anti-VEGF medications injected painlessly into the eye, as well as other possible treatment modalities, including thermal laser therapy. Fellow eye involvement and risks were discussed with the patient. Upon the finding of wet age related macular degeneration, treatment will be offered. The treatment regimen is on a treat as needed basis with the intent to treat if necessary and extend interval of exams when possible. On average 1 out of 6 patients do not need lifetime therapy. However, the risk of recurrent disease is high for a lifetime.  Initially monthly, then periodic, examinations and evaluations will determine whether the next treatment is required on the day of the examination.  We will repeat intravitreal Avastin OD today and examination in 6 weeks repeat intravitreal Avastin today to prevent scotoma enlargement OD, still active temporal to the fovea, atrophy limits the acuity centrally OD

## 2020-07-28 ENCOUNTER — Ambulatory Visit (INDEPENDENT_AMBULATORY_CARE_PROVIDER_SITE_OTHER): Payer: 59 | Admitting: Ophthalmology

## 2020-07-28 ENCOUNTER — Other Ambulatory Visit: Payer: Self-pay

## 2020-07-28 ENCOUNTER — Encounter (INDEPENDENT_AMBULATORY_CARE_PROVIDER_SITE_OTHER): Payer: Self-pay | Admitting: Ophthalmology

## 2020-07-28 DIAGNOSIS — H353122 Nonexudative age-related macular degeneration, left eye, intermediate dry stage: Secondary | ICD-10-CM

## 2020-07-28 DIAGNOSIS — H43391 Other vitreous opacities, right eye: Secondary | ICD-10-CM | POA: Diagnosis not present

## 2020-07-28 DIAGNOSIS — H353114 Nonexudative age-related macular degeneration, right eye, advanced atrophic with subfoveal involvement: Secondary | ICD-10-CM | POA: Diagnosis not present

## 2020-07-28 DIAGNOSIS — H353211 Exudative age-related macular degeneration, right eye, with active choroidal neovascularization: Secondary | ICD-10-CM | POA: Diagnosis not present

## 2020-07-28 NOTE — Assessment & Plan Note (Signed)
Patient is inquiring about the use of her see home for monocular medical monitoring of the left eye.  The patient was given a brochure regarding [employer] system reviewed, and a prescription written today completed on her behalf will be faxed to the home office for this company

## 2020-07-28 NOTE — Assessment & Plan Note (Signed)
Improved anatomy status post injection Avastin 4 days previous, yet patient experiencing some mild to moderate discomfort and pain as well as more importantly floaters in the right eye at times.  I explained that the injections do mobilized natural floaters inside the eye and these can become at times symptomatic

## 2020-07-28 NOTE — Progress Notes (Signed)
07/28/2020     CHIEF COMPLAINT Patient presents for Eye Pain   HISTORY OF PRESENT ILLNESS: Alexandria Morris is a 84 y.o. female who presents to the clinic today for:   HPI    Eye Pain      In right eye.  Characterized as aching.  Occurring constantly.  Duration of 2 weeks.  Treatments tried include no treatments.          Comments    Pt states that since her last injection OD, she has had a dull ache and a lot of floaters. Patient states that the outside of her eye has been hurting as well.       Last edited by Berenice Bouton on 07/28/2020 10:15 AM. (History)      Referring physician: Agustina Caroli, MD No address on file  HISTORICAL INFORMATION:   Selected notes from the MEDICAL RECORD NUMBER       CURRENT MEDICATIONS: No current outpatient medications on file. (Ophthalmic Drugs)   No current facility-administered medications for this visit. (Ophthalmic Drugs)   Current Outpatient Medications (Other)  Medication Sig  . acetaminophen (TYLENOL) 500 MG tablet Take 500 mg by mouth every 4 (four) hours as needed.  Marland Kitchen albuterol (PROAIR HFA) 108 (90 BASE) MCG/ACT inhaler Inhale 2 puffs into the lungs every 4 (four) hours as needed for wheezing or shortness of breath.  . Cholecalciferol (VITAMIN D) 400 UNITS capsule Take 1 capsule by mouth daily.  . clonazePAM (KLONOPIN) 0.5 MG tablet Take 0.5-1 mg by mouth 3 (three) times daily as needed.   . diltiazem (CARDIZEM CD) 240 MG 24 hr capsule Take 240 mg by mouth daily.  . fish oil-omega-3 fatty acids 1000 MG capsule Take 1 g by mouth daily.  . Multiple Vitamins-Minerals (PRESERVISION AREDS 2) CAPS Take 1 capsule by mouth 2 (two) times daily.  Marland Kitchen olmesartan (BENICAR) 20 MG tablet Take 20 mg by mouth daily.  Marland Kitchen omeprazole (PRILOSEC) 40 MG capsule Take 20 mg by mouth daily.   . QUEtiapine (SEROQUEL) 25 MG tablet Take 25 mg by mouth at bedtime.   . sodium chloride (OCEAN) 0.65 % SOLN nasal spray Place 1 spray into both nostrils  as needed for congestion.  . SYMBICORT 80-4.5 MCG/ACT inhaler INHALE 2 PUFFS INTO THE LUNGS 2 (TWO) TIMES DAILY.  . SYMBICORT 80-4.5 MCG/ACT inhaler INHALE 2 PUFFS INTO THE LUNGS 2 (TWO) TIMES DAILY.  . Triamcinolone Acetonide (NASACORT ALLERGY 24HR NA) Place into the nose.  . venlafaxine XR (EFFEXOR XR) 37.5 MG 24 hr capsule Take 1 capsule by mouth daily.  Marland Kitchen warfarin (COUMADIN) 5 MG tablet Take 1 tablet by mouth as directed.   No current facility-administered medications for this visit. (Other)      REVIEW OF SYSTEMS:    ALLERGIES Allergies  Allergen Reactions  . Metoprolol Tartrate Itching    And nightmares  . Albuterol     hyper  . Bentyl [Dicyclomine Hcl]     Does not remember  . Brimonidine   . Cetirizine     Other reaction(s): DRYNESS  . Claritin [Loratadine]     Could not breathe good  . Fexofenadine Other (See Comments)    Bad after taste  . Haldol Decanoate [Haloperidol Decanoate]     Felt weird  . Lovaza [Omega-3-Acid Ethyl Esters]     Fish taste  . Pravastatin   . Simvastatin   . Triavil [Perphenazine-Amitriptyline]     weird    PAST MEDICAL HISTORY Past  Medical History:  Diagnosis Date  . Acute bronchitis   . Acute sinusitis   . Asthma   . Ataxia   . Cellulitis   . Chest pain   . COPD (chronic obstructive pulmonary disease) (HCC)   . Depression   . Dermatitis   . HTN (hypertension)   . Hypercholesteremia   . Hyperglycemia   . Hypertension   . IBS (irritable bowel syndrome)   . Incontinence of urine   . Insomnia   . Microalbuminuria   . OCD (obsessive compulsive disorder)   . Osteoarthritis   . Osteopenia   . Osteoporosis   . Reflux    Past Surgical History:  Procedure Laterality Date  . BLADDER SURGERY    . CATARACT EXTRACTION W/PHACO Bilateral   . HERNIA REPAIR      FAMILY HISTORY Family History  Problem Relation Age of Onset  . Heart disease Other   . Breast cancer Other   . Coronary artery disease Other   . Depression  Other   . Diabetes Other   . Hyperlipidemia Other   . Hypertension Other   . Osteoporosis Other   . Kidney disease Other   . Lung disease Other   . Stroke Other     SOCIAL HISTORY Social History   Tobacco Use  . Smoking status: Never Smoker  . Smokeless tobacco: Never Used  Substance Use Topics  . Alcohol use: No  . Drug use: No         OPHTHALMIC EXAM:  Base Eye Exam    Visual Acuity (Snellen - Linear)      Right Left   Dist cc CF @ 6' 20/25-2   Dist ph cc NI    Correction: Glasses       Tonometry (Tonopen, 10:17 AM)      Right Left   Pressure 19 18       Neuro/Psych    Oriented x3: Yes   Mood/Affect: Normal       Dilation    Right eye: 1.0% Mydriacyl, 2.5% Phenylephrine @ 10:17 AM        Slit Lamp and Fundus Exam    External Exam      Right Left   External Normal Normal       Slit Lamp Exam      Right Left   Lids/Lashes Normal Normal   Conjunctiva/Sclera White and quiet White and quiet   Cornea Clear Clear   Anterior Chamber Deep and quiet, no cells Deep and quiet   Iris Round and reactive Round and reactive   Lens Posterior chamber intraocular lens Posterior chamber intraocular lens   Anterior Vitreous Normal Normal       Fundus Exam      Right Left   Posterior Vitreous ,, Central vitreous floaters    Disc Normal    C/D Ratio 0.5    Macula Geographic atrophy in the FAZ, Hemorrhage, inferiorly, Macular thickening probably, Disciform scar, Less Macular thickening    Vessels Normal    Periphery Normal           IMAGING AND PROCEDURES  Imaging and Procedures for 07/28/20  OCT, Retina - OU - Both Eyes       Right Eye Quality was good. Scan locations included subfoveal. Central Foveal Thickness: 222. Progression has improved. Findings include disciform scar, subretinal hyper-reflective material, retinal drusen .   Left Eye Quality was good. Scan locations included subfoveal. Central Foveal Thickness: 276. Progression has been  stable.  Findings include no SRF, abnormal foveal contour, retinal drusen .   Notes  now 14 days status post recent injection intravitreal Avastin.  Much less retinal thickening and less intraretinal fluid and subretinal fluid overlying disciform scar temporal to the macular region. OD  Will observe and asked the patient return as scheduled for possible repeat injection OD  Active CNVM OS                ASSESSMENT/PLAN:  Advanced nonexudative age-related macular degeneration of right eye with subfoveal involvement Improved anatomy status post injection Avastin 4 days previous, yet patient experiencing some mild to moderate discomfort and pain as well as more importantly floaters in the right eye at times.  I explained that the injections do mobilized natural floaters inside the eye and these can become at times symptomatic  Intermediate stage nonexudative age-related macular degeneration of left eye Patient is inquiring about the use of her see home for monocular medical monitoring of the left eye.  The patient was given a brochure regarding [employer] system reviewed, and a prescription written today completed on her behalf will be faxed to the home office for this company      ICD-10-CM   1. Exudative age-related macular degeneration of right eye with active choroidal neovascularization (HCC)  H35.3211 OCT, Retina - OU - Both Eyes  2. Advanced nonexudative age-related macular degeneration of right eye with subfoveal involvement  H35.3114   3. Vitreous floaters of right eye  H43.391   4. Intermediate stage nonexudative age-related macular degeneration of left eye  H35.3122     1.  Patient reassured regarding the findings of the right eye today no signs of infection  2.  Patient given information for home monitoring given her monocular status, the left eye  3.  Ophthalmic Meds Ordered this visit:  No orders of the defined types were placed in this encounter.      Return for  As scheduled, dilate, OD, AVASTIN OCT.  There are no Patient Instructions on file for this visit.   Explained the diagnoses, plan, and follow up with the patient and they expressed understanding.  Patient expressed understanding of the importance of proper follow up care.   Alford Highland Arnisha Laffoon M.D. Diseases & Surgery of the Retina and Vitreous [facility] 07/28/20     Abbreviations: M myopia (nearsighted); A astigmatism; H hyperopia (farsighted); P presbyopia; Mrx spectacle prescription;  CTL contact lenses; OD right eye; OS left eye; OU both eyes  XT exotropia; ET esotropia; PEK punctate epithelial keratitis; PEE punctate epithelial erosions; DES dry eye syndrome; MGD meibomian gland dysfunction; ATs artificial tears; PFAT's preservative free artificial tears; NSC nuclear sclerotic cataract; PSC posterior subcapsular cataract; ERM epi-retinal membrane; PVD posterior vitreous detachment; RD retinal detachment; DM diabetes mellitus; DR diabetic retinopathy; NPDR non-proliferative diabetic retinopathy; PDR proliferative diabetic retinopathy; CSME clinically significant macular edema; DME diabetic macular edema; dbh dot blot hemorrhages; CWS cotton wool spot; POAG primary open angle glaucoma; C/D cup-to-disc ratio; HVF humphrey visual field; GVF goldmann visual field; OCT optical coherence tomography; IOP intraocular pressure; BRVO Branch retinal vein occlusion; CRVO central retinal vein occlusion; CRAO central retinal artery occlusion; BRAO branch retinal artery occlusion; RT retinal tear; SB scleral buckle; PPV pars plana vitrectomy; VH Vitreous hemorrhage; PRP panretinal laser photocoagulation; IVK intravitreal kenalog; VMT vitreomacular traction; MH Macular hole;  NVD neovascularization of the disc; NVE neovascularization elsewhere; AREDS age related eye disease study; ARMD age related macular degeneration; POAG primary open angle  glaucoma; EBMD epithelial/anterior basement membrane  dystrophy; ACIOL anterior chamber intraocular lens; IOL intraocular lens; PCIOL posterior chamber intraocular lens; Phaco/IOL phacoemulsification with intraocular lens placement; PRK photorefractive keratectomy; LASIK laser assisted in situ keratomileusis; HTN hypertension; DM diabetes mellitus; COPD chronic obstructive pulmonary disease

## 2020-08-25 ENCOUNTER — Encounter (INDEPENDENT_AMBULATORY_CARE_PROVIDER_SITE_OTHER): Payer: 59 | Admitting: Ophthalmology

## 2020-08-25 ENCOUNTER — Other Ambulatory Visit: Payer: Self-pay

## 2020-08-25 ENCOUNTER — Encounter (INDEPENDENT_AMBULATORY_CARE_PROVIDER_SITE_OTHER): Payer: Self-pay | Admitting: Ophthalmology

## 2020-08-25 ENCOUNTER — Ambulatory Visit (INDEPENDENT_AMBULATORY_CARE_PROVIDER_SITE_OTHER): Payer: 59 | Admitting: Ophthalmology

## 2020-08-25 DIAGNOSIS — H353211 Exudative age-related macular degeneration, right eye, with active choroidal neovascularization: Secondary | ICD-10-CM | POA: Diagnosis not present

## 2020-08-25 MED ORDER — BEVACIZUMAB CHEMO INJECTION 1.25MG/0.05ML SYRINGE FOR KALEIDOSCOPE
1.2500 mg | INTRAVITREAL | Status: AC | PRN
Start: 2020-08-25 — End: 2020-08-25
  Administered 2020-08-25: 1.25 mg via INTRAVITREAL

## 2020-08-25 NOTE — Assessment & Plan Note (Signed)
Subfoveal atrophy OD yet with CNVM on the temporal aspect of this area, will repeat intravitreal Avastin today to control and prevent scotoma growth

## 2020-08-25 NOTE — Patient Instructions (Signed)
Patient to report promptly new onset visual acuity distortions or declines in either eye

## 2020-08-25 NOTE — Progress Notes (Signed)
08/25/2020     CHIEF COMPLAINT Patient presents for Retina Follow Up   HISTORY OF PRESENT ILLNESS: Alexandria Morris is a 84 y.o. female who presents to the clinic today for:   HPI    Retina Follow Up    Patient presents with  Wet AMD.  In right eye.  This started 4 weeks ago.  Severity is mild.  Duration of 4 weeks.  Since onset it is stable.          Comments    4 Week AMD F/U OD, poss Avastin OD  Pt denies noticeable changes to [place] OU since last visit. Pt denies ocular pain, flashes of light, or floaters OU. Pt sts, "I don't think it's getting any worse."       Last edited by Ileana Roup, COA on 08/25/2020  9:44 AM. (History)      Referring physician: Agustina Caroli, MD No address on file  HISTORICAL INFORMATION:   Selected notes from the MEDICAL RECORD NUMBER       CURRENT MEDICATIONS: No current outpatient medications on file. (Ophthalmic Drugs)   No current facility-administered medications for this visit. (Ophthalmic Drugs)   Current Outpatient Medications (Other)  Medication Sig  . acetaminophen (TYLENOL) 500 MG tablet Take 500 mg by mouth every 4 (four) hours as needed.  Marland Kitchen albuterol (PROAIR HFA) 108 (90 BASE) MCG/ACT inhaler Inhale 2 puffs into the lungs every 4 (four) hours as needed for wheezing or shortness of breath.  . Cholecalciferol (VITAMIN D) 400 UNITS capsule Take 1 capsule by mouth daily.  . clonazePAM (KLONOPIN) 0.5 MG tablet Take 0.5-1 mg by mouth 3 (three) times daily as needed.   . diltiazem (CARDIZEM CD) 240 MG 24 hr capsule Take 240 mg by mouth daily.  . fish oil-omega-3 fatty acids 1000 MG capsule Take 1 g by mouth daily.  . Multiple Vitamins-Minerals (PRESERVISION AREDS 2) CAPS Take 1 capsule by mouth 2 (two) times daily.  Marland Kitchen olmesartan (BENICAR) 20 MG tablet Take 20 mg by mouth daily.  Marland Kitchen omeprazole (PRILOSEC) 40 MG capsule Take 20 mg by mouth daily.   . QUEtiapine (SEROQUEL) 25 MG tablet Take 25 mg by mouth at bedtime.   . sodium  chloride (OCEAN) 0.65 % SOLN nasal spray Place 1 spray into both nostrils as needed for congestion.  . SYMBICORT 80-4.5 MCG/ACT inhaler INHALE 2 PUFFS INTO THE LUNGS 2 (TWO) TIMES DAILY.  . SYMBICORT 80-4.5 MCG/ACT inhaler INHALE 2 PUFFS INTO THE LUNGS 2 (TWO) TIMES DAILY.  . Triamcinolone Acetonide (NASACORT ALLERGY 24HR NA) Place into the nose.  . venlafaxine XR (EFFEXOR XR) 37.5 MG 24 hr capsule Take 1 capsule by mouth daily.  Marland Kitchen warfarin (COUMADIN) 5 MG tablet Take 1 tablet by mouth as directed.   No current facility-administered medications for this visit. (Other)      REVIEW OF SYSTEMS:    ALLERGIES Allergies  Allergen Reactions  . Metoprolol Tartrate Itching    And nightmares  . Albuterol     hyper  . Bentyl [Dicyclomine Hcl]     Does not remember  . Brimonidine   . Cetirizine     Other reaction(s): DRYNESS  . Claritin [Loratadine]     Could not breathe good  . Fexofenadine Other (See Comments)    Bad after taste  . Haldol Decanoate [Haloperidol Decanoate]     Felt weird  . Lovaza [Omega-3-Acid Ethyl Esters]     Fish taste  . Pravastatin   .  Simvastatin   . Triavil [Perphenazine-Amitriptyline]     weird    PAST MEDICAL HISTORY Past Medical History:  Diagnosis Date  . Acute bronchitis   . Acute sinusitis   . Asthma   . Ataxia   . Cellulitis   . Chest pain   . COPD (chronic obstructive pulmonary disease) (HCC)   . Depression   . Dermatitis   . HTN (hypertension)   . Hypercholesteremia   . Hyperglycemia   . Hypertension   . IBS (irritable bowel syndrome)   . Incontinence of urine   . Insomnia   . Microalbuminuria   . OCD (obsessive compulsive disorder)   . Osteoarthritis   . Osteopenia   . Osteoporosis   . Reflux    Past Surgical History:  Procedure Laterality Date  . BLADDER SURGERY    . CATARACT EXTRACTION W/PHACO Bilateral   . HERNIA REPAIR      FAMILY HISTORY Family History  Problem Relation Age of Onset  . Heart disease Other   .  Breast cancer Other   . Coronary artery disease Other   . Depression Other   . Diabetes Other   . Hyperlipidemia Other   . Hypertension Other   . Osteoporosis Other   . Kidney disease Other   . Lung disease Other   . Stroke Other     SOCIAL HISTORY Social History   Tobacco Use  . Smoking status: Never Smoker  . Smokeless tobacco: Never Used  Substance Use Topics  . Alcohol use: No  . Drug use: No         OPHTHALMIC EXAM: Base Eye Exam    Visual Acuity (ETDRS)      Right Left   Dist cc CF @ 6' 20/25 +2   Dist ph cc NI    Correction: Glasses       Tonometry (Tonopen, 9:45 AM)      Right Left   Pressure 19 20       Pupils      Pupils Dark Light Shape React APD   Right PERRL 3 3 Round Minimal None   Left PERRL 4 4 Round Minimal None       Visual Fields (Counting fingers)      Left Right    Full Full       Extraocular Movement      Right Left    Full Full       Neuro/Psych    Oriented x3: Yes   Mood/Affect: Normal       Dilation    Right eye: 1.0% Mydriacyl, 2.5% Phenylephrine @ 9:48 AM        Slit Lamp and Fundus Exam    External Exam      Right Left   External Normal Normal       Slit Lamp Exam      Right Left   Lids/Lashes Normal Normal   Conjunctiva/Sclera White and quiet White and quiet   Cornea Clear Clear   Anterior Chamber Deep and quiet, no cells Deep and quiet   Iris Round and reactive Round and reactive   Lens Posterior chamber intraocular lens Posterior chamber intraocular lens   Anterior Vitreous Normal Normal       Fundus Exam      Right Left   Posterior Vitreous ,, Central vitreous floaters    Disc Normal    C/D Ratio 0.5    Macula Geographic atrophy in the FAZ, Hemorrhage, inferiorly, Macular thickening probably,  Disciform scar, Less Macular thickening    Vessels Normal    Periphery Normal           IMAGING AND PROCEDURES  Imaging and Procedures for 08/25/20  OCT, Retina - OU - Both Eyes       Right  Eye Quality was good. Scan locations included subfoveal. Central Foveal Thickness: 221. Progression has improved. Findings include disciform scar, subretinal hyper-reflective material, retinal drusen .   Left Eye Quality was good. Scan locations included subfoveal. Central Foveal Thickness: 273. Progression has been stable. Findings include no SRF, abnormal foveal contour, retinal drusen .   Notes  now 6 weeks status post recent injection intravitreal Avastin.  Much less retinal thickening and less intraretinal fluid and subretinal fluid overlying disciform scar temporal to the macular region. OD  Will repeat injection and Avastin today and asked the patient return in 6 weeks  NO active CNVM OS                ASSESSMENT/PLAN:  Exudative age-related macular degeneration of right eye with active choroidal neovascularization (HCC) Subfoveal atrophy OD yet with CNVM on the temporal aspect of this area, will repeat intravitreal Avastin today to control and prevent scotoma growth      ICD-10-CM   1. Exudative age-related macular degeneration of right eye with active choroidal neovascularization (HCC)  H35.3211 OCT, Retina - OU - Both Eyes    1.  Repeat intravitreal Avastin OD today to control scotoma growth and examination again in 6 weeks  2.  3.  Ophthalmic Meds Ordered this visit:  No orders of the defined types were placed in this encounter.      Return in about 6 weeks (around 10/06/2020) for dilate, OD, AVASTIN OCT.  There are no Patient Instructions on file for this visit.   Explained the diagnoses, plan, and follow up with the patient and they expressed understanding.  Patient expressed understanding of the importance of proper follow up care.   Alford Highland Corynn Solberg M.D. Diseases & Surgery of the Retina and Vitreous [facility] 08/25/20     Abbreviations: M myopia (nearsighted); A astigmatism; H hyperopia (farsighted); P presbyopia; Mrx  spectacle prescription;  CTL contact lenses; OD right eye; OS left eye; OU both eyes  XT exotropia; ET esotropia; PEK punctate epithelial keratitis; PEE punctate epithelial erosions; DES dry eye syndrome; MGD meibomian gland dysfunction; ATs artificial tears; PFAT's preservative free artificial tears; NSC nuclear sclerotic cataract; PSC posterior subcapsular cataract; ERM epi-retinal membrane; PVD posterior vitreous detachment; RD retinal detachment; DM diabetes mellitus; DR diabetic retinopathy; NPDR non-proliferative diabetic retinopathy; PDR proliferative diabetic retinopathy; CSME clinically significant macular edema; DME diabetic macular edema; dbh dot blot hemorrhages; CWS cotton wool spot; POAG primary open angle glaucoma; C/D cup-to-disc ratio; HVF humphrey visual field; GVF goldmann visual field; OCT optical coherence tomography; IOP intraocular pressure; BRVO Branch retinal vein occlusion; CRVO central retinal vein occlusion; CRAO central retinal artery occlusion; BRAO branch retinal artery occlusion; RT retinal tear; SB scleral buckle; PPV pars plana vitrectomy; VH Vitreous hemorrhage; PRP panretinal laser photocoagulation; IVK intravitreal kenalog; VMT vitreomacular traction; MH Macular hole;  NVD neovascularization of the disc; NVE neovascularization elsewhere; AREDS age related eye disease study; ARMD age related macular degeneration; POAG primary open angle glaucoma; EBMD epithelial/anterior basement membrane dystrophy; ACIOL anterior chamber intraocular lens; IOL intraocular lens; PCIOL posterior chamber intraocular lens; Phaco/IOL phacoemulsification with intraocular lens placement; PRK photorefractive keratectomy; LASIK laser assisted in situ keratomileusis; HTN hypertension; DM diabetes  mellitus; COPD chronic obstructive pulmonary disease

## 2020-10-06 ENCOUNTER — Ambulatory Visit (INDEPENDENT_AMBULATORY_CARE_PROVIDER_SITE_OTHER): Payer: 59 | Admitting: Ophthalmology

## 2020-10-06 ENCOUNTER — Encounter (INDEPENDENT_AMBULATORY_CARE_PROVIDER_SITE_OTHER): Payer: Self-pay | Admitting: Ophthalmology

## 2020-10-06 ENCOUNTER — Other Ambulatory Visit: Payer: Self-pay

## 2020-10-06 DIAGNOSIS — H3561 Retinal hemorrhage, right eye: Secondary | ICD-10-CM | POA: Diagnosis not present

## 2020-10-06 DIAGNOSIS — H353114 Nonexudative age-related macular degeneration, right eye, advanced atrophic with subfoveal involvement: Secondary | ICD-10-CM

## 2020-10-06 DIAGNOSIS — H43391 Other vitreous opacities, right eye: Secondary | ICD-10-CM | POA: Diagnosis not present

## 2020-10-06 DIAGNOSIS — H353211 Exudative age-related macular degeneration, right eye, with active choroidal neovascularization: Secondary | ICD-10-CM | POA: Diagnosis not present

## 2020-10-06 MED ORDER — BEVACIZUMAB CHEMO INJECTION 1.25MG/0.05ML SYRINGE FOR KALEIDOSCOPE
1.2500 mg | INTRAVITREAL | Status: AC | PRN
  Administered 2020-10-06: 1.25 mg via INTRAVITREAL

## 2020-10-06 NOTE — Assessment & Plan Note (Signed)
Clearly smaller OD

## 2020-10-06 NOTE — Assessment & Plan Note (Signed)
Stable floaters, no worsening, no holes or tears

## 2020-10-06 NOTE — Assessment & Plan Note (Signed)
Atrophy accounts for foveal acuity

## 2020-10-06 NOTE — Progress Notes (Signed)
10/06/2020     CHIEF COMPLAINT Patient presents for Retina Follow Up   HISTORY OF PRESENT ILLNESS: Alexandria Morris is a 84 y.o. female who presents to the clinic today for:   HPI    Retina Follow Up    Patient presents with  Wet AMD.  In right eye.  This started 6 weeks ago.  Severity is mild.  Duration of 6 weeks.  Since onset it is stable.          Comments    6 Week AMD F/U OD, poss Avastin OD  Pt denies noticeable changes to [place] OU since last visit. Pt denies ocular pain, flashes of light, or floaters OU.         Last edited by Ileana Roup, COA on 10/06/2020  9:48 AM. (History)      Referring physician: Agustina Caroli, MD No address on file  HISTORICAL INFORMATION:   Selected notes from the MEDICAL RECORD NUMBER       CURRENT MEDICATIONS: No current outpatient medications on file. (Ophthalmic Drugs)   No current facility-administered medications for this visit. (Ophthalmic Drugs)   Current Outpatient Medications (Other)  Medication Sig  . acetaminophen (TYLENOL) 500 MG tablet Take 500 mg by mouth every 4 (four) hours as needed.  Marland Kitchen albuterol (PROAIR HFA) 108 (90 BASE) MCG/ACT inhaler Inhale 2 puffs into the lungs every 4 (four) hours as needed for wheezing or shortness of breath.  . Cholecalciferol (VITAMIN D) 400 UNITS capsule Take 1 capsule by mouth daily.  . clonazePAM (KLONOPIN) 0.5 MG tablet Take 0.5-1 mg by mouth 3 (three) times daily as needed.   . diltiazem (CARDIZEM CD) 240 MG 24 hr capsule Take 240 mg by mouth daily.  . fish oil-omega-3 fatty acids 1000 MG capsule Take 1 g by mouth daily.  . Multiple Vitamins-Minerals (PRESERVISION AREDS 2) CAPS Take 1 capsule by mouth 2 (two) times daily.  Marland Kitchen olmesartan (BENICAR) 20 MG tablet Take 20 mg by mouth daily.  Marland Kitchen omeprazole (PRILOSEC) 40 MG capsule Take 20 mg by mouth daily.   . QUEtiapine (SEROQUEL) 25 MG tablet Take 25 mg by mouth at bedtime.   . sodium chloride (OCEAN) 0.65 % SOLN nasal spray  Place 1 spray into both nostrils as needed for congestion.  . SYMBICORT 80-4.5 MCG/ACT inhaler INHALE 2 PUFFS INTO THE LUNGS 2 (TWO) TIMES DAILY.  . SYMBICORT 80-4.5 MCG/ACT inhaler INHALE 2 PUFFS INTO THE LUNGS 2 (TWO) TIMES DAILY.  . Triamcinolone Acetonide (NASACORT ALLERGY 24HR NA) Place into the nose.  . venlafaxine XR (EFFEXOR XR) 37.5 MG 24 hr capsule Take 1 capsule by mouth daily.  Marland Kitchen warfarin (COUMADIN) 5 MG tablet Take 1 tablet by mouth as directed.   No current facility-administered medications for this visit. (Other)      REVIEW OF SYSTEMS:    ALLERGIES Allergies  Allergen Reactions  . Metoprolol Tartrate Itching    And nightmares  . Albuterol     hyper  . Bentyl [Dicyclomine Hcl]     Does not remember  . Brimonidine   . Cetirizine     Other reaction(s): DRYNESS  . Claritin [Loratadine]     Could not breathe good  . Fexofenadine Other (See Comments)    Bad after taste  . Haldol Decanoate [Haloperidol Decanoate]     Felt weird  . Lovaza [Omega-3-Acid Ethyl Esters]     Fish taste  . Pravastatin   . Simvastatin   . Triavil [Perphenazine-Amitriptyline]  weird    PAST MEDICAL HISTORY Past Medical History:  Diagnosis Date  . Acute bronchitis   . Acute sinusitis   . Asthma   . Ataxia   . Cellulitis   . Chest pain   . COPD (chronic obstructive pulmonary disease) (HCC)   . Depression   . Dermatitis   . HTN (hypertension)   . Hypercholesteremia   . Hyperglycemia   . Hypertension   . IBS (irritable bowel syndrome)   . Incontinence of urine   . Insomnia   . Microalbuminuria   . OCD (obsessive compulsive disorder)   . Osteoarthritis   . Osteopenia   . Osteoporosis   . Reflux    Past Surgical History:  Procedure Laterality Date  . BLADDER SURGERY    . CATARACT EXTRACTION W/PHACO Bilateral   . HERNIA REPAIR      FAMILY HISTORY Family History  Problem Relation Age of Onset  . Heart disease Other   . Breast cancer Other   . Coronary artery  disease Other   . Depression Other   . Diabetes Other   . Hyperlipidemia Other   . Hypertension Other   . Osteoporosis Other   . Kidney disease Other   . Lung disease Other   . Stroke Other     SOCIAL HISTORY Social History   Tobacco Use  . Smoking status: Never Smoker  . Smokeless tobacco: Never Used  Substance Use Topics  . Alcohol use: No  . Drug use: No         OPHTHALMIC EXAM:  Base Eye Exam    Visual Acuity (ETDRS)      Right Left   Dist cc CF @ 6' 20/25 -1   Dist ph cc NI    Correction: Glasses       Tonometry (Tonopen, 9:52 AM)      Right Left   Pressure 13 15       Pupils      Dark Light Shape React APD   Right 3 3 Round Minimal None   Left 3 3 Irregular Minimal None       Visual Fields (Counting fingers)      Left Right    Full Full       Extraocular Movement      Right Left    Full Full       Neuro/Psych    Oriented x3: Yes   Mood/Affect: Normal       Dilation    Right eye: 1.0% Mydriacyl, 2.5% Phenylephrine @ 9:54 AM        Slit Lamp and Fundus Exam    External Exam      Right Left   External Normal Normal       Slit Lamp Exam      Right Left   Lids/Lashes Normal Normal   Conjunctiva/Sclera White and quiet White and quiet   Cornea Clear Clear   Anterior Chamber Deep and quiet, no cells Deep and quiet   Iris Round and reactive Round and reactive   Lens Posterior chamber intraocular lens Posterior chamber intraocular lens   Anterior Vitreous Normal Normal       Fundus Exam      Right Left   Posterior Vitreous ,, Central vitreous floaters    Disc Normal    C/D Ratio 0.5    Macula Geographic atrophy in the FAZ, Hemorrhage, inferiorly, Macular thickening probably, Disciform scar, Less Macular thickening    Vessels Normal  Periphery Normal           IMAGING AND PROCEDURES  Imaging and Procedures for 10/06/20  OCT, Retina - OU - Both Eyes       Right Eye Quality was good. Scan locations included subfoveal.  Central Foveal Thickness: 243. Progression has improved. Findings include disciform scar, subretinal hyper-reflective material, retinal drusen , outer retinal atrophy, central retinal atrophy.   Left Eye Quality was good. Scan locations included subfoveal. Central Foveal Thickness: 6. Progression has been stable. Findings include no SRF, abnormal foveal contour, retinal drusen .   Notes  now 6 weeks status post recent injection intravitreal Avastin.  Much less retinal thickening and less intraretinal fluid and subretinal fluid overlying disciform scar temporal to the macular region. OD  Will repeat injection and Avastin today and asked the patient return in 8 weeks  NO active CNVM OS       Intravitreal Injection, Pharmacologic Agent - OD - Right Eye       Time Out 10/06/2020. 10:48 AM. Confirmed correct patient, procedure, site, and patient consented.   Anesthesia Topical anesthesia was used. Anesthetic medications included Akten 3.5%.   Procedure Preparation included Ofloxacin , Tobramycin 0.3%, 10% betadine to eyelids, 5% betadine to ocular surface. A 30 gauge needle was used.   Injection:  1.25 mg Bevacizumab (AVASTIN) SOLN   NDC: 70360-001-02, Lot: [id number]   Route: Intravitreal, Site: Right Eye, Waste: 0 mg  Post-op Post injection exam found visual acuity of at least counting fingers. The patient tolerated the procedure well. There were no complications. The patient received written and verbal post procedure care education. Post injection medications were not given.                 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 Exudative age-related macular degeneration of right eye with active choroidal neovascularization (HCC)  H35.3211 OCT, Retina - OU - Both Eyes    Intravitreal Injection, Pharmacologic Agent - OD - Right Eye    Bevacizumab (AVASTIN) SOLN 1.25 mg  2. Advanced nonexudative age-related macular degeneration of right eye with subfoveal involvement  H35.3114   3. Retinal hemorrhage of right eye  H35.61   4. Vitreous floaters, right  H43.391     1.  2.  3.  Ophthalmic Meds Ordered this visit:  Meds ordered this encounter  Medications  . Bevacizumab (AVASTIN) SOLN 1.25 mg       Return in about 8 weeks (around 12/01/2020) for dilate, OD, AVASTIN OCT.  There are no Patient Instructions on file for this visit.   Explained the diagnoses, plan, and follow up with the patient and they expressed understanding.  Patient expressed understanding of the importance of proper follow up care.   Alford Highland Ger Nicks M.D. Diseases & Surgery of the Retina and Vitreous [facility] 10/06/20     Abbreviations: M myopia (nearsighted); A astigmatism; H hyperopia (farsighted); P presbyopia; Mrx spectacle prescription;  CTL contact lenses; OD right eye; OS left eye; OU both eyes  XT exotropia; ET esotropia; PEK punctate epithelial keratitis; PEE punctate epithelial erosions; DES dry eye syndrome;  MGD meibomian gland dysfunction; ATs artificial tears; PFAT's preservative free artificial tears; NSC nuclear sclerotic cataract; PSC posterior subcapsular cataract; ERM epi-retinal membrane; PVD posterior vitreous detachment; RD retinal detachment; DM diabetes mellitus; DR diabetic retinopathy; NPDR non-proliferative diabetic retinopathy; PDR proliferative diabetic retinopathy; CSME clinically significant macular edema; DME diabetic macular edema; dbh dot blot hemorrhages; CWS cotton wool spot; POAG primary open angle glaucoma;  C/D cup-to-disc ratio; HVF humphrey visual field; GVF goldmann visual field; OCT optical coherence tomography; IOP intraocular pressure; BRVO Branch retinal vein occlusion; CRVO central retinal vein occlusion; CRAO central retinal artery occlusion; BRAO branch retinal artery occlusion; RT retinal tear; SB scleral buckle; PPV pars plana vitrectomy; VH Vitreous hemorrhage; PRP panretinal laser photocoagulation; IVK intravitreal kenalog; VMT vitreomacular traction; MH Macular hole;  NVD neovascularization of the disc; NVE neovascularization elsewhere; AREDS age related eye disease study; ARMD age related macular degeneration; POAG primary open angle glaucoma; EBMD epithelial/anterior basement membrane dystrophy; ACIOL anterior chamber intraocular lens; IOL intraocular lens; PCIOL posterior chamber intraocular lens; Phaco/IOL phacoemulsification with intraocular lens placement; PRK photorefractive keratectomy; LASIK laser assisted in situ keratomileusis; HTN hypertension; DM diabetes mellitus; COPD chronic obstructive pulmonary disease

## 2020-10-06 NOTE — Assessment & Plan Note (Signed)
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

## 2020-12-01 ENCOUNTER — Other Ambulatory Visit: Payer: Self-pay

## 2020-12-01 ENCOUNTER — Ambulatory Visit (INDEPENDENT_AMBULATORY_CARE_PROVIDER_SITE_OTHER): Payer: 59 | Admitting: Ophthalmology

## 2020-12-01 ENCOUNTER — Encounter (INDEPENDENT_AMBULATORY_CARE_PROVIDER_SITE_OTHER): Payer: Self-pay | Admitting: Ophthalmology

## 2020-12-01 DIAGNOSIS — H353122 Nonexudative age-related macular degeneration, left eye, intermediate dry stage: Secondary | ICD-10-CM | POA: Diagnosis not present

## 2020-12-01 DIAGNOSIS — H353114 Nonexudative age-related macular degeneration, right eye, advanced atrophic with subfoveal involvement: Secondary | ICD-10-CM | POA: Diagnosis not present

## 2020-12-01 DIAGNOSIS — H353211 Exudative age-related macular degeneration, right eye, with active choroidal neovascularization: Secondary | ICD-10-CM

## 2020-12-01 MED ORDER — BEVACIZUMAB 2.5 MG/0.1ML IZ SOSY
2.5000 mg | PREFILLED_SYRINGE | INTRAVITREAL | Status: AC | PRN
  Administered 2020-12-01: 2.5 mg via INTRAVITREAL

## 2020-12-01 NOTE — Progress Notes (Signed)
12/01/2020     CHIEF COMPLAINT Patient presents for Retina Follow Up (8 WK FU OD, POSS AVASTIN OD///Pt reports "film" over both eyes, some sensitivity to bright light, no new f/f, no pain or pressure. )   HISTORY OF PRESENT ILLNESS: Alexandria Morris is a 84 y.o. female who presents to the clinic today for:   HPI    Retina Follow Up    In right eye.  This started 8 weeks ago.  Duration of 8 weeks. Additional comments: 8 WK FU OD, POSS AVASTIN OD   Pt reports "film" over both eyes, some sensitivity to bright light, no new f/f, no pain or pressure.        Last edited by Varney Biles D on 12/01/2020 10:02 AM. (History)      Referring physician: Agustina Caroli, MD No address on file  HISTORICAL INFORMATION:   Selected notes from the MEDICAL RECORD NUMBER       CURRENT MEDICATIONS: No current outpatient medications on file. (Ophthalmic Drugs)   No current facility-administered medications for this visit. (Ophthalmic Drugs)   Current Outpatient Medications (Other)  Medication Sig  . acetaminophen (TYLENOL) 500 MG tablet Take 500 mg by mouth every 4 (four) hours as needed.  Marland Kitchen albuterol (PROAIR HFA) 108 (90 BASE) MCG/ACT inhaler Inhale 2 puffs into the lungs every 4 (four) hours as needed for wheezing or shortness of breath.  . Cholecalciferol (VITAMIN D) 400 UNITS capsule Take 1 capsule by mouth daily.  . clonazePAM (KLONOPIN) 0.5 MG tablet Take 0.5-1 mg by mouth 3 (three) times daily as needed.   . diltiazem (CARDIZEM CD) 240 MG 24 hr capsule Take 240 mg by mouth daily.  . fish oil-omega-3 fatty acids 1000 MG capsule Take 1 g by mouth daily.  . Multiple Vitamins-Minerals (PRESERVISION AREDS 2) CAPS Take 1 capsule by mouth 2 (two) times daily.  Marland Kitchen olmesartan (BENICAR) 20 MG tablet Take 20 mg by mouth daily.  Marland Kitchen omeprazole (PRILOSEC) 40 MG capsule Take 20 mg by mouth daily.   . QUEtiapine (SEROQUEL) 25 MG tablet Take 25 mg by mouth at bedtime.   . sodium chloride  (OCEAN) 0.65 % SOLN nasal spray Place 1 spray into both nostrils as needed for congestion.  . SYMBICORT 80-4.5 MCG/ACT inhaler INHALE 2 PUFFS INTO THE LUNGS 2 (TWO) TIMES DAILY.  . SYMBICORT 80-4.5 MCG/ACT inhaler INHALE 2 PUFFS INTO THE LUNGS 2 (TWO) TIMES DAILY.  . Triamcinolone Acetonide (NASACORT ALLERGY 24HR NA) Place into the nose.  . venlafaxine XR (EFFEXOR XR) 37.5 MG 24 hr capsule Take 1 capsule by mouth daily.  Marland Kitchen warfarin (COUMADIN) 5 MG tablet Take 1 tablet by mouth as directed.   No current facility-administered medications for this visit. (Other)      REVIEW OF SYSTEMS:    ALLERGIES Allergies  Allergen Reactions  . Metoprolol Tartrate Itching    And nightmares  . Albuterol     hyper  . Bentyl [Dicyclomine Hcl]     Does not remember  . Brimonidine   . Cetirizine     Other reaction(s): DRYNESS  . Claritin [Loratadine]     Could not breathe good  . Fexofenadine Other (See Comments)    Bad after taste  . Haldol Decanoate [Haloperidol Decanoate]     Felt weird  . Lovaza [Omega-3-Acid Ethyl Esters]     Fish taste  . Pravastatin   . Simvastatin   . Triavil [Perphenazine-Amitriptyline]     weird  PAST MEDICAL HISTORY Past Medical History:  Diagnosis Date  . Acute bronchitis   . Acute sinusitis   . Asthma   . Ataxia   . Cellulitis   . Chest pain   . COPD (chronic obstructive pulmonary disease) (HCC)   . Depression   . Dermatitis   . HTN (hypertension)   . Hypercholesteremia   . Hyperglycemia   . Hypertension   . IBS (irritable bowel syndrome)   . Incontinence of urine   . Insomnia   . Microalbuminuria   . OCD (obsessive compulsive disorder)   . Osteoarthritis   . Osteopenia   . Osteoporosis   . Reflux    Past Surgical History:  Procedure Laterality Date  . BLADDER SURGERY    . CATARACT EXTRACTION W/PHACO Bilateral   . HERNIA REPAIR      FAMILY HISTORY Family History  Problem Relation Age of Onset  . Heart disease Other   . Breast  cancer Other   . Coronary artery disease Other   . Depression Other   . Diabetes Other   . Hyperlipidemia Other   . Hypertension Other   . Osteoporosis Other   . Kidney disease Other   . Lung disease Other   . Stroke Other     SOCIAL HISTORY Social History   Tobacco Use  . Smoking status: Never Smoker  . Smokeless tobacco: Never Used  Substance Use Topics  . Alcohol use: No  . Drug use: No         OPHTHALMIC EXAM: Base Eye Exam    Visual Acuity (ETDRS)      Right Left   Dist cc CF at 4' 20/25 +2   Correction: Glasses       Tonometry (Tonopen, 10:07 AM)      Right Left   Pressure 17 18       Pupils      Dark Light Shape React APD   Right 3 3 Round Minimal None   Left 3 3 Irregular Minimal None       Visual Fields (Counting fingers)      Left Right    Full Full       Extraocular Movement      Right Left    Full Full       Neuro/Psych    Oriented x3: Yes   Mood/Affect: Normal       Dilation    Right eye: 1.0% Mydriacyl, 2.5% Phenylephrine @ 10:07 AM        Slit Lamp and Fundus Exam    External Exam      Right Left   External Normal Normal       Slit Lamp Exam      Right Left   Lids/Lashes Normal Normal   Conjunctiva/Sclera White and quiet White and quiet   Cornea Clear Clear   Anterior Chamber Deep and quiet, no cells Deep and quiet   Iris Round and reactive Round and reactive   Lens Posterior chamber intraocular lens Posterior chamber intraocular lens   Anterior Vitreous Normal Normal          IMAGING AND PROCEDURES  Imaging and Procedures for 12/01/20  OCT, Retina - OU - Both Eyes       Right Eye Quality was good. Scan locations included subfoveal. Central Foveal Thickness: 253. Progression has been stable. Findings include subretinal scarring, intraretinal fluid, cystoid macular edema.   Left Eye Quality was good. Scan locations included subfoveal. Central Foveal Thickness:  273. Progression has been stable. Findings  include retinal drusen , abnormal foveal contour, no IRF, no SRF.   Notes CNVM, intraretinal fluid active in the temporal aspect of the fovea yet stable at 8-week follow-up, acuity limited by subfoveal atrophy.  OD.  OS, no signs of CNVM       Intravitreal Injection, Pharmacologic Agent - OD - Right Eye       Time Out 12/01/2020. 10:42 AM. Confirmed correct patient, procedure, site, and patient consented.   Anesthesia Topical anesthesia was used. Anesthetic medications included Akten 3.5%.   Procedure Preparation included Ofloxacin , 10% betadine to eyelids, 5% betadine to ocular surface. A 30 gauge needle was used.   Injection:  2.5 mg Bevacizumab (AVASTIN) 2.5mg /0.71mL SOSY   NDC: 69450-388-82, Lot: [id number]   Route: Intravitreal, Site: Right Eye  Post-op Post injection exam found visual acuity of at least counting fingers. The patient tolerated the procedure well. There were no complications. The patient received written and verbal post procedure care education. Post injection medications were not given.                 ASSESSMENT/PLAN:  Exudative age-related macular degeneration of right eye with active choroidal neovascularization (HCC) Stable juxta foveal CNVM temporal aspect of the fovea, at 8-week follow-up, repeat injection today and examination 8 weeks OU  Advanced nonexudative age-related macular degeneration of right eye with subfoveal involvement Foveal atrophy accounts for acuity OD      ICD-10-CM   1. Exudative age-related macular degeneration of right eye with active choroidal neovascularization (HCC)  H35.3211 OCT, Retina - OU - Both Eyes    Intravitreal Injection, Pharmacologic Agent - OD - Right Eye    bevacizumab (AVASTIN) SOSY 2.5 mg  2. Advanced nonexudative age-related macular degeneration of right eye with subfoveal involvement  H35.3114     1.  Repeat injection intravitreal Avastin OD today, 8-week interval examination in 8 weeks with goal  of maintaining, current anatomic structure macula OD to prevent scotoma enlargement  2.  3.  Ophthalmic Meds Ordered this visit:  Meds ordered this encounter  Medications  . bevacizumab (AVASTIN) SOSY 2.5 mg       Return in about 8 weeks (around 01/26/2021) for DILATE OU, AVASTIN OCT, OD.  There are no Patient Instructions on file for this visit.   Explained the diagnoses, plan, and follow up with the patient and they expressed understanding.  Patient expressed understanding of the importance of proper follow up care.   Alford Highland Alexandria Morris M.D. Diseases & Surgery of the Retina and Vitreous [facility] 12/01/20     Abbreviations: M myopia (nearsighted); A astigmatism; H hyperopia (farsighted); P presbyopia; Mrx spectacle prescription;  CTL contact lenses; OD right eye; OS left eye; OU both eyes  XT exotropia; ET esotropia; PEK punctate epithelial keratitis; PEE punctate epithelial erosions; DES dry eye syndrome; MGD meibomian gland dysfunction; ATs artificial tears; PFAT's preservative free artificial tears; NSC nuclear sclerotic cataract; PSC posterior subcapsular cataract; ERM epi-retinal membrane; PVD posterior vitreous detachment; RD retinal detachment; DM diabetes mellitus; DR diabetic retinopathy; NPDR non-proliferative diabetic retinopathy; PDR proliferative diabetic retinopathy; CSME clinically significant macular edema; DME diabetic macular edema; dbh dot blot hemorrhages; CWS cotton wool spot; POAG primary open angle glaucoma; C/D cup-to-disc ratio; HVF humphrey visual field; GVF goldmann visual field; OCT optical coherence tomography; IOP intraocular pressure; BRVO Branch retinal vein occlusion; CRVO central retinal vein occlusion; CRAO central retinal artery occlusion; BRAO branch retinal artery occlusion; RT  retinal tear; SB scleral buckle; PPV pars plana vitrectomy; VH Vitreous hemorrhage; PRP panretinal laser photocoagulation; IVK intravitreal kenalog; VMT  vitreomacular traction; MH Macular hole;  NVD neovascularization of the disc; NVE neovascularization elsewhere; AREDS age related eye disease study; ARMD age related macular degeneration; POAG primary open angle glaucoma; EBMD epithelial/anterior basement membrane dystrophy; ACIOL anterior chamber intraocular lens; IOL intraocular lens; PCIOL posterior chamber intraocular lens; Phaco/IOL phacoemulsification with intraocular lens placement; PRK photorefractive keratectomy; LASIK laser assisted in situ keratomileusis; HTN hypertension; DM diabetes mellitus; COPD chronic obstructive pulmonary disease

## 2020-12-01 NOTE — Assessment & Plan Note (Signed)
Foveal atrophy accounts for acuity OD

## 2020-12-01 NOTE — Assessment & Plan Note (Addendum)
Stable juxta foveal CNVM temporal aspect of the fovea, at 8-week follow-up, repeat injection today and examination 8 weeks OU

## 2020-12-01 NOTE — Assessment & Plan Note (Signed)
No signs of CNVM by OCT 

## 2020-12-01 NOTE — Patient Instructions (Signed)
Instructed to contact the office promptly new onset visual acuity decline or distortions 

## 2021-01-26 ENCOUNTER — Encounter (INDEPENDENT_AMBULATORY_CARE_PROVIDER_SITE_OTHER): Payer: 59 | Admitting: Ophthalmology

## 2021-02-03 ENCOUNTER — Ambulatory Visit (INDEPENDENT_AMBULATORY_CARE_PROVIDER_SITE_OTHER): Payer: 59 | Admitting: Ophthalmology

## 2021-02-03 ENCOUNTER — Encounter (INDEPENDENT_AMBULATORY_CARE_PROVIDER_SITE_OTHER): Payer: Self-pay | Admitting: Ophthalmology

## 2021-02-03 ENCOUNTER — Other Ambulatory Visit: Payer: Self-pay

## 2021-02-03 ENCOUNTER — Encounter (INDEPENDENT_AMBULATORY_CARE_PROVIDER_SITE_OTHER): Payer: 59 | Admitting: Ophthalmology

## 2021-02-03 DIAGNOSIS — H353122 Nonexudative age-related macular degeneration, left eye, intermediate dry stage: Secondary | ICD-10-CM | POA: Diagnosis not present

## 2021-02-03 DIAGNOSIS — H353114 Nonexudative age-related macular degeneration, right eye, advanced atrophic with subfoveal involvement: Secondary | ICD-10-CM

## 2021-02-03 DIAGNOSIS — H353211 Exudative age-related macular degeneration, right eye, with active choroidal neovascularization: Secondary | ICD-10-CM

## 2021-02-03 MED ORDER — BEVACIZUMAB 2.5 MG/0.1ML IZ SOSY
2.5000 mg | PREFILLED_SYRINGE | INTRAVITREAL | Status: AC | PRN
Start: 2021-02-03 — End: 2021-02-03
  Administered 2021-02-03: 2.5 mg via INTRAVITREAL

## 2021-02-03 NOTE — Assessment & Plan Note (Signed)
Accounts for acuity 

## 2021-02-03 NOTE — Assessment & Plan Note (Signed)
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

## 2021-02-03 NOTE — Assessment & Plan Note (Signed)
No signs of CNVM left eye today patient continues on oral vitamin therapy, AREDS 2

## 2021-02-03 NOTE — Progress Notes (Addendum)
02/03/2021     CHIEF COMPLAINT Patient presents for Retina Follow Up (9 Week F/U OU, poss Avastin OD//Pt denies noticeable changes to [place] OU since last visit. Pt denies ocular pain, flashes of light, or floaters OU. //)   HISTORY OF PRESENT ILLNESS: Alexandria Morris is a 85 y.o. female who presents to the clinic today for:   HPI    Retina Follow Up    Patient presents with  Wet AMD.  In both eyes.  This started 9 weeks ago.  Severity is mild.  Duration of 9 weeks.  Since onset it is stable. Additional comments: 9 Week F/U OU, poss Avastin OD  Pt denies noticeable changes to [place] OU since last visit. Pt denies ocular pain, flashes of light, or floaters OU.          Last edited by Ileana Roup, COA on 02/03/2021  9:34 AM. (History)      Referring physician: Agustina Caroli, MD [address] [place],  [place] [address]  HISTORICAL INFORMATION:   Selected notes from the MEDICAL RECORD NUMBER       CURRENT MEDICATIONS: No current outpatient medications on file. (Ophthalmic Drugs)   No current facility-administered medications for this visit. (Ophthalmic Drugs)   Current Outpatient Medications (Other)  Medication Sig  . acetaminophen (TYLENOL) 500 MG tablet Take 500 mg by mouth every 4 (four) hours as needed.  Marland Kitchen albuterol (PROAIR HFA) 108 (90 BASE) MCG/ACT inhaler Inhale 2 puffs into the lungs every 4 (four) hours as needed for wheezing or shortness of breath.  . Cholecalciferol (VITAMIN D) 400 UNITS capsule Take 1 capsule by mouth daily.  . clonazePAM (KLONOPIN) 0.5 MG tablet Take 0.5-1 mg by mouth 3 (three) times daily as needed.   . diltiazem (CARDIZEM CD) 240 MG 24 hr capsule Take 240 mg by mouth daily.  . fish oil-omega-3 fatty acids 1000 MG capsule Take 1 g by mouth daily.  . Multiple Vitamins-Minerals (PRESERVISION AREDS 2) CAPS Take 1 capsule by mouth 2 (two) times daily.  Marland Kitchen olmesartan (BENICAR) 20 MG tablet Take 20 mg by mouth daily.  Marland Kitchen omeprazole (PRILOSEC) 40 MG  capsule Take 20 mg by mouth daily.   . QUEtiapine (SEROQUEL) 25 MG tablet Take 25 mg by mouth at bedtime.   . sodium chloride (OCEAN) 0.65 % SOLN nasal spray Place 1 spray into both nostrils as needed for congestion.  . SYMBICORT 80-4.5 MCG/ACT inhaler INHALE 2 PUFFS INTO THE LUNGS 2 (TWO) TIMES DAILY.  . SYMBICORT 80-4.5 MCG/ACT inhaler INHALE 2 PUFFS INTO THE LUNGS 2 (TWO) TIMES DAILY.  . Triamcinolone Acetonide (NASACORT ALLERGY 24HR NA) Place into the nose.  . venlafaxine XR (EFFEXOR XR) 37.5 MG 24 hr capsule Take 1 capsule by mouth daily.  Marland Kitchen warfarin (COUMADIN) 5 MG tablet Take 1 tablet by mouth as directed.   No current facility-administered medications for this visit. (Other)      REVIEW OF SYSTEMS:    ALLERGIES Allergies  Allergen Reactions  . Metoprolol Tartrate Itching    And nightmares  . Albuterol     hyper  . Bentyl [Dicyclomine Hcl]     Does not remember  . Brimonidine   . Cetirizine     Other reaction(s): DRYNESS  . Claritin [Loratadine]     Could not breathe good  . Fexofenadine Other (See Comments)    Bad after taste  . Haldol Decanoate [Haloperidol Decanoate]     Felt weird  . Lovaza [Omega-3-Acid Ethyl Esters]  Fish taste  . Pravastatin   . Simvastatin   . Triavil [Perphenazine-Amitriptyline]     weird    PAST MEDICAL HISTORY Past Medical History:  Diagnosis Date  . Acute bronchitis   . Acute sinusitis   . Asthma   . Ataxia   . Cellulitis   . Chest pain   . COPD (chronic obstructive pulmonary disease) (HCC)   . Depression   . Dermatitis   . HTN (hypertension)   . Hypercholesteremia   . Hyperglycemia   . Hypertension   . IBS (irritable bowel syndrome)   . Incontinence of urine   . Insomnia   . Microalbuminuria   . OCD (obsessive compulsive disorder)   . Osteoarthritis   . Osteopenia   . Osteoporosis   . Reflux    Past Surgical History:  Procedure Laterality Date  . BLADDER SURGERY    . CATARACT EXTRACTION W/PHACO Bilateral    . HERNIA REPAIR      FAMILY HISTORY Family History  Problem Relation Age of Onset  . Heart disease Other   . Breast cancer Other   . Coronary artery disease Other   . Depression Other   . Diabetes Other   . Hyperlipidemia Other   . Hypertension Other   . Osteoporosis Other   . Kidney disease Other   . Lung disease Other   . Stroke Other     SOCIAL HISTORY Social History   Tobacco Use  . Smoking status: Never Smoker  . Smokeless tobacco: Never Used  Substance Use Topics  . Alcohol use: No  . Drug use: No         OPHTHALMIC EXAM:  Base Eye Exam    Visual Acuity (ETDRS)      Right Left   Dist cc CF @ 4' 20/25 +1   Dist ph cc NI    Correction: Glasses       Tonometry (Tonopen, 9:34 AM)      Right Left   Pressure 11 12       Pupils      Pupils Dark Light Shape React APD   Right PERRL 3 3 Round Minimal None   Left PERRL 3 3 Round Minimal None       Visual Fields (Counting fingers)      Left Right    Full Full       Extraocular Movement      Right Left    Full Full       Neuro/Psych    Oriented x3: Yes   Mood/Affect: Normal       Dilation    Both eyes: 1.0% Mydriacyl, 2.5% Phenylephrine @ 9:37 AM        Slit Lamp and Fundus Exam    External Exam      Right Left   External Normal Normal       Slit Lamp Exam      Right Left   Lids/Lashes Normal Normal   Conjunctiva/Sclera White and quiet White and quiet   Cornea Clear Clear   Anterior Chamber Deep and quiet, no cells Deep and quiet   Iris Round and reactive Round and reactive   Lens Posterior chamber intraocular lens Posterior chamber intraocular lens   Anterior Vitreous Normal Normal       Fundus Exam      Right Left   Posterior Vitreous ,, Central vitreous floaters    Disc Normal    C/D Ratio 0.5    Macula Geographic  atrophy in the FAZ, Hemorrhage, inferiorly, Macular thickening probably, Disciform scar on the temporal edge of the atrophy, appears to be RCA (retinal choroidal  anastomoses), Less Macular thickening Geographic atrophy, Hemorrhage, Macular thickening on the inferior edge of the central foveal RPE atrophy   Vessels Normal    Periphery Normal           IMAGING AND PROCEDURES  Imaging and Procedures for 02/03/21  OCT, Retina - OU - Both Eyes       Right Eye Quality was good. Scan locations included subfoveal. Central Foveal Thickness: 256. Progression has been stable. Findings include subretinal scarring, intraretinal fluid, cystoid macular edema.   Left Eye Quality was good. Scan locations included subfoveal. Central Foveal Thickness: 282. Progression has been stable. Findings include retinal drusen , abnormal foveal contour, no IRF, no SRF.   Notes CNVM, intraretinal fluid active in the temporal aspect of the fovea yet stable at 9-week follow-up, acuity limited by subfoveal atrophy.  OD.  OS, no signs of CNVM       Intravitreal Injection, Pharmacologic Agent - OD - Right Eye       Time Out 02/03/2021. 11:05 AM. Confirmed correct patient, procedure, site, and patient consented.   Anesthesia Topical anesthesia was used. Anesthetic medications included Akten 3.5%.   Procedure Preparation included Ofloxacin , 10% betadine to eyelids, 5% betadine to ocular surface. A 30 gauge needle was used.   Injection:  2.5 mg Bevacizumab (AVASTIN) 2.5mg /0.58mL SOSY   NDC: 03009-233-00, Lot: [id number]   Route: Intravitreal, Site: Right Eye  Post-op Post injection exam found visual acuity of at least counting fingers. The patient tolerated the procedure well. There were no complications. The patient received written and verbal post procedure care education. Post injection medications were not given.                 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  Intermediate stage nonexudative age-related macular degeneration of left eye No signs of CNVM left eye today patient continues on oral vitamin therapy, AREDS 2      ICD-10-CM   1. Exudative age-related macular degeneration of right eye with active choroidal neovascularization (HCC)  H35.3211 OCT, Retina - OU - Both Eyes    Intravitreal Injection, Pharmacologic Agent - OD - Right Eye    bevacizumab (AVASTIN) SOSY 2.5 mg  2. Advanced nonexudative age-related macular degeneration of right eye with subfoveal involvement  H35.3114   3. Intermediate stage nonexudative age-related macular degeneration of left eye  H35.3122     1. Right eye with a fairly resistant form of CNVM on the temporal aspect of the fovea, edge of geographic atrophy. No interval change in region of intraretinal fluid by OCT or examination. Clinical examination confirmed this is a retinal choroidal anastomosis form of wet ARMD, thus the goal will be to prevent enlargement. Treat with Avastin OD today and extend interval of treatment and evaluation to 28-month  2. Dilate OU next  3.  Ophthalmic Meds Ordered this visit:  Meds ordered this encounter  Medications  . bevacizumab (AVASTIN) SOSY 2.5 mg       Return in about 3 months (around 05/03/2021) for DILATE OU, AVASTIN OCT, OD.  There are no Patient Instructions on file for this visit.  Explained the diagnoses, plan, and follow up with the patient and they expressed understanding.  Patient expressed understanding of the importance of proper follow up care.   Alford Highland Ilai Hiller M.D. Diseases & Surgery of the Retina and Vitreous [facility] 02/03/21     Abbreviations: M myopia (nearsighted); A astigmatism; H hyperopia (farsighted); P presbyopia; Mrx spectacle prescription;  CTL contact lenses; OD right eye; OS left eye; OU both eyes  XT  exotropia; ET esotropia; PEK punctate epithelial keratitis; PEE punctate epithelial erosions; DES dry eye syndrome; MGD meibomian gland dysfunction; ATs artificial tears; PFAT's preservative free artificial tears; NSC nuclear sclerotic cataract; PSC posterior subcapsular cataract; ERM epi-retinal membrane; PVD posterior vitreous detachment; RD retinal detachment; DM diabetes mellitus; DR diabetic retinopathy; NPDR non-proliferative diabetic retinopathy; PDR proliferative diabetic retinopathy; CSME clinically significant macular edema; DME diabetic macular edema; dbh dot blot hemorrhages; CWS cotton wool spot; POAG primary open angle glaucoma; C/D cup-to-disc ratio; HVF humphrey visual field; GVF goldmann visual field; OCT optical coherence tomography; IOP intraocular pressure; BRVO Branch retinal vein occlusion; CRVO central retinal vein occlusion; CRAO central retinal artery occlusion; BRAO branch retinal artery occlusion; RT retinal tear; SB scleral buckle; PPV pars plana vitrectomy; VH Vitreous hemorrhage; PRP panretinal laser photocoagulation; IVK intravitreal kenalog; VMT vitreomacular traction; MH Macular hole;  NVD neovascularization of the disc; NVE neovascularization elsewhere; AREDS age related eye disease study; ARMD age related macular degeneration; POAG primary open angle glaucoma; EBMD epithelial/anterior basement membrane dystrophy; ACIOL anterior chamber intraocular lens; IOL intraocular lens; PCIOL posterior chamber intraocular lens; Phaco/IOL phacoemulsification with intraocular lens placement; PRK photorefractive keratectomy; LASIK laser assisted in situ keratomileusis; HTN hypertension; DM diabetes mellitus; COPD chronic obstructive pulmonary disease

## 2021-02-25 ENCOUNTER — Other Ambulatory Visit: Payer: Self-pay

## 2021-02-25 ENCOUNTER — Encounter (INDEPENDENT_AMBULATORY_CARE_PROVIDER_SITE_OTHER): Payer: Self-pay | Admitting: Ophthalmology

## 2021-02-25 ENCOUNTER — Ambulatory Visit (INDEPENDENT_AMBULATORY_CARE_PROVIDER_SITE_OTHER): Payer: 59 | Admitting: Ophthalmology

## 2021-02-25 DIAGNOSIS — H353211 Exudative age-related macular degeneration, right eye, with active choroidal neovascularization: Secondary | ICD-10-CM

## 2021-02-25 DIAGNOSIS — H353122 Nonexudative age-related macular degeneration, left eye, intermediate dry stage: Secondary | ICD-10-CM

## 2021-02-25 NOTE — Progress Notes (Signed)
02/25/2021     CHIEF COMPLAINT Patient presents for Retina Follow Up (3 week s\p Avastin inj OD./Pt states there was a change in OS on the [employer]. Pt has noticed a slight change in vision. )   HISTORY OF PRESENT ILLNESS: Alexandria Morris is a 85 y.o. female who presents to the clinic today for:   HPI    Retina Follow Up    Patient presents with  Wet AMD.  In right eye.  Severity is moderate.  Duration of 3 weeks.  Since onset it is stable.  I, the attending physician,  performed the HPI with the patient and updated documentation appropriately. Additional comments: 3 week s\p Avastin inj OD. Pt states there was a change in OS on the [employer] Machine. Pt has noticed a slight change in vision.        Last edited by Elyse Jarvislayton, Kriston M on 02/25/2021  9:39 AM. (History)      Referring physician: Agustina CaroliHicks, Kristin D, MD [address] [place] [id number]  HISTORICAL INFORMATION:   Selected notes from the MEDICAL RECORD NUMBER       CURRENT MEDICATIONS: No current outpatient medications on file. (Ophthalmic Drugs)   No current facility-administered medications for this visit. (Ophthalmic Drugs)   Current Outpatient Medications (Other)  Medication Sig  . acetaminophen (TYLENOL) 500 MG tablet Take 500 mg by mouth every 4 (four) hours as needed.  Marland Kitchen. albuterol (PROAIR HFA) 108 (90 BASE) MCG/ACT inhaler Inhale 2 puffs into the lungs every 4 (four) hours as needed for wheezing or shortness of breath.  . Cholecalciferol (VITAMIN D) 400 UNITS capsule Take 1 capsule by mouth daily.  . clonazePAM (KLONOPIN) 0.5 MG tablet Take 0.5-1 mg by mouth 3 (three) times daily as needed.   . diltiazem (CARDIZEM CD) 240 MG 24 hr capsule Take 240 mg by mouth daily.  . fish oil-omega-3 fatty acids 1000 MG capsule Take 1 g by mouth daily.  . Multiple Vitamins-Minerals (PRESERVISION AREDS 2) CAPS Take 1 capsule by mouth 2 (two) times daily.  Marland Kitchen. olmesartan (BENICAR) 20 MG tablet Take 20 mg by  mouth daily.  Marland Kitchen. omeprazole (PRILOSEC) 40 MG capsule Take 20 mg by mouth daily.   . QUEtiapine (SEROQUEL) 25 MG tablet Take 25 mg by mouth at bedtime.   . sodium chloride (OCEAN) 0.65 % SOLN nasal spray Place 1 spray into both nostrils as needed for congestion.  . SYMBICORT 80-4.5 MCG/ACT inhaler INHALE 2 PUFFS INTO THE LUNGS 2 (TWO) TIMES DAILY.  . SYMBICORT 80-4.5 MCG/ACT inhaler INHALE 2 PUFFS INTO THE LUNGS 2 (TWO) TIMES DAILY.  . Triamcinolone Acetonide (NASACORT ALLERGY 24HR NA) Place into the nose.  . venlafaxine XR (EFFEXOR XR) 37.5 MG 24 hr capsule Take 1 capsule by mouth daily.  Marland Kitchen. warfarin (COUMADIN) 5 MG tablet Take 1 tablet by mouth as directed.   No current facility-administered medications for this visit. (Other)      REVIEW OF SYSTEMS:    ALLERGIES Allergies  Allergen Reactions  . Metoprolol Tartrate Itching    And nightmares  . Albuterol     hyper  . Bentyl [Dicyclomine Hcl]     Does not remember  . Brimonidine   . Cetirizine     Other reaction(s): DRYNESS  . Claritin [Loratadine]     Could not breathe good  . Fexofenadine Other (See Comments)    Bad after taste  . Haldol Decanoate [Haloperidol Decanoate]     Felt weird  .  Lovaza [Omega-3-Acid Ethyl Esters]     Fish taste  . Pravastatin   . Simvastatin   . Triavil [Perphenazine-Amitriptyline]     weird    PAST MEDICAL HISTORY Past Medical History:  Diagnosis Date  . Acute bronchitis   . Acute sinusitis   . Asthma   . Ataxia   . Cellulitis   . Chest pain   . COPD (chronic obstructive pulmonary disease) (HCC)   . Depression   . Dermatitis   . HTN (hypertension)   . Hypercholesteremia   . Hyperglycemia   . Hypertension   . IBS (irritable bowel syndrome)   . Incontinence of urine   . Insomnia   . Microalbuminuria   . OCD (obsessive compulsive disorder)   . Osteoarthritis   . Osteopenia   . Osteoporosis   . Reflux    Past Surgical History:  Procedure Laterality Date  . BLADDER SURGERY     . CATARACT EXTRACTION W/PHACO Bilateral   . HERNIA REPAIR      FAMILY HISTORY Family History  Problem Relation Age of Onset  . Heart disease Other   . Breast cancer Other   . Coronary artery disease Other   . Depression Other   . Diabetes Other   . Hyperlipidemia Other   . Hypertension Other   . Osteoporosis Other   . Kidney disease Other   . Lung disease Other   . Stroke Other     SOCIAL HISTORY Social History   Tobacco Use  . Smoking status: Never Smoker  . Smokeless tobacco: Never Used  Substance Use Topics  . Alcohol use: No  . Drug use: No         OPHTHALMIC EXAM: Base Eye Exam    Visual Acuity (Snellen - Linear)      Right Left   Dist cc CF @ 4' 20/25 -1   Dist ph cc NI    Correction: Glasses       Tonometry (Tonopen, 9:43 AM)      Right Left   Pressure 15 17       Pupils      Pupils Dark Light Shape React APD   Right PERRL 2 2 Round Minimal None   Left PERRL 2 2 Round Minimal None       Neuro/Psych    Oriented x3: Yes   Mood/Affect: Normal       Dilation    Both eyes: 1.0% Mydriacyl, 2.5% Phenylephrine @ 9:43 AM        Slit Lamp and Fundus Exam    External Exam      Right Left   External Normal Normal       Slit Lamp Exam      Right Left   Lids/Lashes Normal Normal   Conjunctiva/Sclera White and quiet White and quiet   Cornea Clear Clear   Anterior Chamber Deep and quiet, no cells Deep and quiet   Iris Round and reactive Round and reactive   Lens Posterior chamber intraocular lens Posterior chamber intraocular lens   Anterior Vitreous Normal Normal       Fundus Exam      Right Left   Posterior Vitreous ,, Central vitreous floaters Posterior vitreous detachment   Disc Normal Normal   C/D Ratio 0.5 0.5   Macula Geographic atrophy in the FAZ, Hemorrhage, inferiorly, Macular thickening probably, Disciform scar on the temporal edge of the atrophy, appears to be RCA (retinal choroidal anastomoses), Less Macular thickening  Intermediate age  related macular degeneration, Pigmented atrophy, Soft drusen, no macular thickening, no hemorrhage   Vessels Normal Normal   Periphery Normal Normal          IMAGING AND PROCEDURES  Imaging and Procedures for 02/25/21  OCT, Retina - OU - Both Eyes       Right Eye Quality was good. Scan locations included subfoveal. Central Foveal Thickness: 235. Progression has improved. Findings include abnormal foveal contour, cystoid macular edema.   Left Eye Quality was good. Scan locations included subfoveal. Central Foveal Thickness: 283. Progression has been stable. Findings include no IRF, no SRF, retinal drusen , abnormal foveal contour.   Notes OD with much less intraretinal fluid and scarring temporally now 3 weeks post Avastin follow-up as scheduled right eye  Left eye with no signs of active CNVM                ASSESSMENT/PLAN:  Exudative age-related macular degeneration of right eye with active choroidal neovascularization (HCC) Improved at 3 weeks post Avastin follow-up as scheduled  Intermediate stage nonexudative age-related macular degeneration of left eye No signs of CNVM on clinical examination nor high-definition OCT OS      ICD-10-CM   1. Exudative age-related macular degeneration of right eye with active choroidal neovascularization (HCC)  H35.3211 OCT, Retina - OU - Both Eyes  2. Intermediate stage nonexudative age-related macular degeneration of left eye  H35.3122     1.  OD improving post recent Avastin follow-up as scheduled  2.  OS with recent signal for possible change in foresee home monitoring micro scotoma, yet no sign of CNVM on examination nor on OCT today  3.  Ophthalmic Meds Ordered this visit:  No orders of the defined types were placed in this encounter.      Return for As scheduled, DILATE OU, COLOR FP, AVASTIN OCT, OD.  There are no Patient Instructions on file for this visit.   Explained the diagnoses, plan, and  follow up with the patient and they expressed understanding.  Patient expressed understanding of the importance of proper follow up care.   Alford Highland Rankin M.D. Diseases & Surgery of the Retina and Vitreous [facility] 02/25/21     Abbreviations: M myopia (nearsighted); A astigmatism; H hyperopia (farsighted); P presbyopia; Mrx spectacle prescription;  CTL contact lenses; OD right eye; OS left eye; OU both eyes  XT exotropia; ET esotropia; PEK punctate epithelial keratitis; PEE punctate epithelial erosions; DES dry eye syndrome; MGD meibomian gland dysfunction; ATs artificial tears; PFAT's preservative free artificial tears; NSC nuclear sclerotic cataract; PSC posterior subcapsular cataract; ERM epi-retinal membrane; PVD posterior vitreous detachment; RD retinal detachment; DM diabetes mellitus; DR diabetic retinopathy; NPDR non-proliferative diabetic retinopathy; PDR proliferative diabetic retinopathy; CSME clinically significant macular edema; DME diabetic macular edema; dbh dot blot hemorrhages; CWS cotton wool spot; POAG primary open angle glaucoma; C/D cup-to-disc ratio; HVF humphrey visual field; GVF goldmann visual field; OCT optical coherence tomography; IOP intraocular pressure; BRVO Branch retinal vein occlusion; CRVO central retinal vein occlusion; CRAO central retinal artery occlusion; BRAO branch retinal artery occlusion; RT retinal tear; SB scleral buckle; PPV pars plana vitrectomy; VH Vitreous hemorrhage; PRP panretinal laser photocoagulation; IVK intravitreal kenalog; VMT vitreomacular traction; MH Macular hole;  NVD neovascularization of the disc; NVE neovascularization elsewhere; AREDS age related eye disease study; ARMD age related macular degeneration; POAG primary open angle glaucoma; EBMD epithelial/anterior basement membrane dystrophy; ACIOL anterior chamber intraocular lens; IOL intraocular lens; PCIOL posterior chamber intraocular lens;  Phaco/IOL  phacoemulsification with intraocular lens placement; PRK photorefractive keratectomy; LASIK laser assisted in situ keratomileusis; HTN hypertension; DM diabetes mellitus; COPD chronic obstructive pulmonary disease

## 2021-02-25 NOTE — Assessment & Plan Note (Signed)
Improved at 3 weeks post Avastin follow-up as scheduled

## 2021-02-25 NOTE — Assessment & Plan Note (Signed)
No signs of CNVM on clinical examination nor high-definition OCT OS

## 2021-05-05 ENCOUNTER — Encounter (INDEPENDENT_AMBULATORY_CARE_PROVIDER_SITE_OTHER): Payer: 59 | Admitting: Ophthalmology

## 2021-05-18 ENCOUNTER — Encounter (INDEPENDENT_AMBULATORY_CARE_PROVIDER_SITE_OTHER): Payer: Self-pay | Admitting: Ophthalmology

## 2021-05-18 ENCOUNTER — Other Ambulatory Visit: Payer: Self-pay

## 2021-05-18 ENCOUNTER — Ambulatory Visit (INDEPENDENT_AMBULATORY_CARE_PROVIDER_SITE_OTHER): Payer: 59 | Admitting: Ophthalmology

## 2021-05-18 DIAGNOSIS — H43811 Vitreous degeneration, right eye: Secondary | ICD-10-CM

## 2021-05-18 DIAGNOSIS — H353211 Exudative age-related macular degeneration, right eye, with active choroidal neovascularization: Secondary | ICD-10-CM

## 2021-05-18 DIAGNOSIS — H353122 Nonexudative age-related macular degeneration, left eye, intermediate dry stage: Secondary | ICD-10-CM

## 2021-05-18 MED ORDER — BEVACIZUMAB 2.5 MG/0.1ML IZ SOSY
2.5000 mg | PREFILLED_SYRINGE | INTRAVITREAL | Status: AC | PRN
  Administered 2021-05-18: 2.5 mg via INTRAVITREAL

## 2021-05-18 NOTE — Assessment & Plan Note (Addendum)
Temporal aspect of fovea, increased intraretinal fluid and disciform scar, central foveal atrophy remains OD.  Repeat injection today Avastin and examination next OD in 10 weeks

## 2021-05-18 NOTE — Progress Notes (Signed)
05/18/2021     CHIEF COMPLAINT Patient presents for Retina Follow Up (3 month fu OU and Avastin OD/Pt states VA OU stable since last visit. Pt denies FOL, floaters, or ocular pain OU. /)   HISTORY OF PRESENT ILLNESS: Alexandria Morris is a 85 y.o. female who presents to the clinic today for:   HPI    Retina Follow Up    Diagnosis: Wet AMD   Laterality: right eye   Onset: 3 months ago   Severity: mild   Duration: 3 months   Course: stable   Comments: 3 month fu OU and Avastin OD Pt states VA OU stable since last visit. Pt denies FOL, floaters, or ocular pain OU.         Last edited by Demetrios Loll, COA on 05/18/2021 10:11 AM. (History)      Referring physician: Agustina Caroli, MD [address] [place],  [place] [address]  HISTORICAL INFORMATION:   Selected notes from the MEDICAL RECORD NUMBER       CURRENT MEDICATIONS: No current outpatient medications on file. (Ophthalmic Drugs)   No current facility-administered medications for this visit. (Ophthalmic Drugs)   Current Outpatient Medications (Other)  Medication Sig  . acetaminophen (TYLENOL) 500 MG tablet Take 500 mg by mouth every 4 (four) hours as needed.  Marland Kitchen albuterol (PROAIR HFA) 108 (90 BASE) MCG/ACT inhaler Inhale 2 puffs into the lungs every 4 (four) hours as needed for wheezing or shortness of breath.  . Cholecalciferol (VITAMIN D) 400 UNITS capsule Take 1 capsule by mouth daily.  . clonazePAM (KLONOPIN) 0.5 MG tablet Take 0.5-1 mg by mouth 3 (three) times daily as needed.   . diltiazem (CARDIZEM CD) 240 MG 24 hr capsule Take 240 mg by mouth daily.  . fish oil-omega-3 fatty acids 1000 MG capsule Take 1 g by mouth daily.  . Multiple Vitamins-Minerals (PRESERVISION AREDS 2) CAPS Take 1 capsule by mouth 2 (two) times daily.  Marland Kitchen olmesartan (BENICAR) 20 MG tablet Take 20 mg by mouth daily.  Marland Kitchen omeprazole (PRILOSEC) 40 MG capsule Take 20 mg by mouth daily.   . QUEtiapine (SEROQUEL) 25 MG tablet Take 25 mg by  mouth at bedtime.   . sodium chloride (OCEAN) 0.65 % SOLN nasal spray Place 1 spray into both nostrils as needed for congestion.  . SYMBICORT 80-4.5 MCG/ACT inhaler INHALE 2 PUFFS INTO THE LUNGS 2 (TWO) TIMES DAILY.  . SYMBICORT 80-4.5 MCG/ACT inhaler INHALE 2 PUFFS INTO THE LUNGS 2 (TWO) TIMES DAILY.  . Triamcinolone Acetonide (NASACORT ALLERGY 24HR NA) Place into the nose.  . venlafaxine XR (EFFEXOR XR) 37.5 MG 24 hr capsule Take 1 capsule by mouth daily.  Marland Kitchen warfarin (COUMADIN) 5 MG tablet Take 1 tablet by mouth as directed.   No current facility-administered medications for this visit. (Other)      REVIEW OF SYSTEMS:    ALLERGIES Allergies  Allergen Reactions  . Metoprolol Tartrate Itching    And nightmares  . Albuterol     hyper  . Bentyl [Dicyclomine Hcl]     Does not remember  . Brimonidine   . Cetirizine     Other reaction(s): DRYNESS  . Claritin [Loratadine]     Could not breathe good  . Fexofenadine Other (See Comments)    Bad after taste  . Haldol Decanoate [Haloperidol Decanoate]     Felt weird  . Lovaza [Omega-3-Acid Ethyl Esters]     Fish taste  . Pravastatin   . Simvastatin   .  Triavil [Perphenazine-Amitriptyline]     weird    PAST MEDICAL HISTORY Past Medical History:  Diagnosis Date  . Acute bronchitis   . Acute sinusitis   . Asthma   . Ataxia   . Cellulitis   . Chest pain   . COPD (chronic obstructive pulmonary disease) (HCC)   . Depression   . Dermatitis   . HTN (hypertension)   . Hypercholesteremia   . Hyperglycemia   . Hypertension   . IBS (irritable bowel syndrome)   . Incontinence of urine   . Insomnia   . Microalbuminuria   . OCD (obsessive compulsive disorder)   . Osteoarthritis   . Osteopenia   . Osteoporosis   . Reflux    Past Surgical History:  Procedure Laterality Date  . BLADDER SURGERY    . CATARACT EXTRACTION W/PHACO Bilateral   . HERNIA REPAIR      FAMILY HISTORY Family History  Problem Relation Age of Onset   . Heart disease Other   . Breast cancer Other   . Coronary artery disease Other   . Depression Other   . Diabetes Other   . Hyperlipidemia Other   . Hypertension Other   . Osteoporosis Other   . Kidney disease Other   . Lung disease Other   . Stroke Other     SOCIAL HISTORY Social History   Tobacco Use  . Smoking status: Never Smoker  . Smokeless tobacco: Never Used  Substance Use Topics  . Alcohol use: No  . Drug use: No         OPHTHALMIC EXAM: Base Eye Exam    Visual Acuity (ETDRS)      Right Left   Dist cc CF at 3' 20/25 -1   Correction: Glasses       Tonometry (Tonopen, 10:13 AM)      Right Left   Pressure 17 17       Pupils      Pupils Dark Light Shape React APD   Right PERRL 2 2 Round Minimal None   Left PERRL 2 2 Round Minimal None       Visual Fields (Counting fingers)      Left Right    Full Full       Extraocular Movement      Right Left    Full Full       Neuro/Psych    Oriented x3: Yes   Mood/Affect: Normal       Dilation    Both eyes: 1.0% Mydriacyl, 2.5% Phenylephrine @ 10:13 AM        Slit Lamp and Fundus Exam    External Exam      Right Left   External Normal Normal       Slit Lamp Exam      Right Left   Lids/Lashes Normal Normal   Conjunctiva/Sclera White and quiet White and quiet   Cornea Clear Clear   Anterior Chamber Deep and quiet, no cells Deep and quiet   Iris Round and reactive Round and reactive   Lens Posterior chamber intraocular lens Posterior chamber intraocular lens   Anterior Vitreous Normal Normal       Fundus Exam      Right Left   Posterior Vitreous ,, Central vitreous floaters, Posterior vitreous detachment Posterior vitreous detachment   Disc Normal Normal   C/D Ratio 0.5 0.5   Macula Geographic atrophy in the FAZ, Hemorrhage, inferiorly, Macular thickening probably, Disciform scar on the temporal edge  of the atrophy, appears to be RCA (retinal choroidal anastomoses), Macular thickening  Intermediate age related macular degeneration, Pigmented atrophy, Soft drusen, no macular thickening, no hemorrhage   Vessels Normal Normal   Periphery Normal Normal          IMAGING AND PROCEDURES  Imaging and Procedures for 05/18/21  OCT, Retina - OU - Both Eyes       Right Eye Quality was good. Scan locations included subfoveal. Central Foveal Thickness: 280. Progression has worsened. Findings include abnormal foveal contour, cystoid macular edema.   Left Eye Quality was good. Scan locations included subfoveal. Central Foveal Thickness: 275. Progression has been stable. Findings include no IRF, no SRF, retinal drusen , abnormal foveal contour.   Notes OD with slightly increased intraretinal fluid and scarring temporally now 8 weeks post Avastin follow-up as scheduled right eye  Left eye with no signs of active CNVM       Intravitreal Injection, Pharmacologic Agent - OD - Right Eye       Time Out 05/18/2021. 10:51 AM. Confirmed correct patient, procedure, site, and patient consented.   Anesthesia Topical anesthesia was used. Anesthetic medications included Akten 3.5%.   Procedure Preparation included Ofloxacin , 10% betadine to eyelids, 5% betadine to ocular surface. A 30 gauge needle was used.   Injection:  2.5 mg Bevacizumab (AVASTIN) 2.5mg /0.601mL SOSY   NDC: 81191-478-2971449-091-43, Lot: [id number]   Route: Intravitreal, Site: Right Eye  Post-op Post injection exam found visual acuity of at least counting fingers. The patient tolerated the procedure well. There were no complications. The patient received written and verbal post procedure care education. Post injection medications were not given.                 ASSESSMENT/PLAN:  Exudative age-related macular degeneration of right eye with active choroidal neovascularization (HCC) Temporal aspect of fovea, increased intraretinal fluid and disciform scar, central foveal atrophy remains OD.  Repeat injection today  Avastin and examination next OD in 10 weeks  Intermediate stage nonexudative age-related macular degeneration of left eye No signs of active CNVM will  Posterior vitreous detachment of right eye  The nature of posterior vitreous detachment was discussed with the patient as well as its physiology, its age prevalence, and its possible implication regarding retinal breaks and detachment.  An informational brochure was offered to the patient.  All the patient's questions were answered.  The patient was asked to return if new or different flashes or floaters develops.   Patient was instructed to contact office immediately if any new changes were noticed. I explained to the patient that vitreous inside the eye is similar to jello inside a bowl. As the jello melts it can start to pull away from the bowl, similarly the vitreous throughout our lives can begin to pull away from the retina. That process is called a posterior vitreous detachment. In some cases, the vitreous can tug hard enough on the retina to form a retinal tear. I discussed with the patient the signs and symptoms of a retinal detachment.  Do not rub the eye.        ICD-10-CM   1. Exudative age-related macular degeneration of right eye with active choroidal neovascularization (HCC)  H35.3211 OCT, Retina - OU - Both Eyes    Intravitreal Injection, Pharmacologic Agent - OD - Right Eye    bevacizumab (AVASTIN) SOSY 2.5 mg  2. Intermediate stage nonexudative age-related macular degeneration of left eye  H35.3122   3. Posterior vitreous detachment  of right eye  H43.811     1.  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  Subfoveal atrophy limits acuity OD yet scotoma enlargement risk currently at 75-month follow-up  2.  OD will repeat injection Avastin today and follow-up next in 10 weeks  3.  Late OU next to monitor  Ophthalmic Meds Ordered this visit:  Meds ordered this encounter  Medications  . bevacizumab (AVASTIN) SOSY 2.5  mg       Return in about 10 weeks (around 07/27/2021) for DILATE OU, COLOR FP, AVASTIN OCT, OD.  There are no Patient Instructions on file for this visit.   Explained the diagnoses, plan, and follow up with the patient and they expressed understanding.  Patient expressed understanding of the importance of proper follow up care.   Alford Highland Kate Larock M.D. Diseases & Surgery of the Retina and Vitreous [facility] 05/18/21     Abbreviations: M myopia (nearsighted); A astigmatism; H hyperopia (farsighted); P presbyopia; Mrx spectacle prescription;  CTL contact lenses; OD right eye; OS left eye; OU both eyes  XT exotropia; ET esotropia; PEK punctate epithelial keratitis; PEE punctate epithelial erosions; DES dry eye syndrome; MGD meibomian gland dysfunction; ATs artificial tears; PFAT's preservative free artificial tears; NSC nuclear sclerotic cataract; PSC posterior subcapsular cataract; ERM epi-retinal membrane; PVD posterior vitreous detachment; RD retinal detachment; DM diabetes mellitus; DR diabetic retinopathy; NPDR non-proliferative diabetic retinopathy; PDR proliferative diabetic retinopathy; CSME clinically significant macular edema; DME diabetic macular edema; dbh dot blot hemorrhages; CWS cotton wool spot; POAG primary open angle glaucoma; C/D cup-to-disc ratio; HVF humphrey visual field; GVF goldmann visual field; OCT optical coherence tomography; IOP intraocular pressure; BRVO Branch retinal vein occlusion; CRVO central retinal vein occlusion; CRAO central retinal artery occlusion; BRAO branch retinal artery occlusion; RT retinal tear; SB scleral buckle; PPV pars plana vitrectomy; VH Vitreous hemorrhage; PRP panretinal laser photocoagulation; IVK intravitreal kenalog; VMT vitreomacular traction; MH Macular hole;  NVD neovascularization of the disc; NVE neovascularization elsewhere; AREDS age related eye disease study; ARMD age related macular degeneration; POAG primary open  angle glaucoma; EBMD epithelial/anterior basement membrane dystrophy; ACIOL anterior chamber intraocular lens; IOL intraocular lens; PCIOL posterior chamber intraocular lens; Phaco/IOL phacoemulsification with intraocular lens placement; PRK photorefractive keratectomy; LASIK laser assisted in situ keratomileusis; HTN hypertension; DM diabetes mellitus; COPD chronic obstructive pulmonary disease

## 2021-05-18 NOTE — Assessment & Plan Note (Signed)
No signs of active CNVM will

## 2021-05-18 NOTE — Assessment & Plan Note (Signed)

## 2021-07-27 ENCOUNTER — Encounter (INDEPENDENT_AMBULATORY_CARE_PROVIDER_SITE_OTHER): Payer: 59 | Admitting: Ophthalmology

## 2021-08-05 ENCOUNTER — Encounter (INDEPENDENT_AMBULATORY_CARE_PROVIDER_SITE_OTHER): Payer: Medicare Other | Admitting: Ophthalmology

## 2021-08-11 ENCOUNTER — Encounter (INDEPENDENT_AMBULATORY_CARE_PROVIDER_SITE_OTHER): Payer: Medicare Other | Admitting: Ophthalmology

## 2021-08-31 ENCOUNTER — Encounter (INDEPENDENT_AMBULATORY_CARE_PROVIDER_SITE_OTHER): Payer: Medicare Other | Admitting: Ophthalmology

## 2021-09-02 ENCOUNTER — Encounter (INDEPENDENT_AMBULATORY_CARE_PROVIDER_SITE_OTHER): Payer: Medicare Other | Admitting: Ophthalmology

## 2021-09-16 ENCOUNTER — Encounter (INDEPENDENT_AMBULATORY_CARE_PROVIDER_SITE_OTHER): Payer: Medicare Other | Admitting: Ophthalmology

## 2021-10-12 ENCOUNTER — Encounter (INDEPENDENT_AMBULATORY_CARE_PROVIDER_SITE_OTHER): Payer: Self-pay | Admitting: Ophthalmology

## 2021-10-12 ENCOUNTER — Ambulatory Visit (INDEPENDENT_AMBULATORY_CARE_PROVIDER_SITE_OTHER): Payer: Medicare Other | Admitting: Ophthalmology

## 2021-10-12 ENCOUNTER — Other Ambulatory Visit: Payer: Self-pay

## 2021-10-12 DIAGNOSIS — H353122 Nonexudative age-related macular degeneration, left eye, intermediate dry stage: Secondary | ICD-10-CM

## 2021-10-12 DIAGNOSIS — H353114 Nonexudative age-related macular degeneration, right eye, advanced atrophic with subfoveal involvement: Secondary | ICD-10-CM | POA: Diagnosis not present

## 2021-10-12 DIAGNOSIS — H353211 Exudative age-related macular degeneration, right eye, with active choroidal neovascularization: Secondary | ICD-10-CM

## 2021-10-12 MED ORDER — BEVACIZUMAB 2.5 MG/0.1ML IZ SOSY
2.5000 mg | PREFILLED_SYRINGE | INTRAVITREAL | Status: AC | PRN
  Administered 2021-10-12: 2.5 mg via INTRAVITREAL

## 2021-10-12 NOTE — Assessment & Plan Note (Signed)
Chronic subfoveal active CNVM with perifoveal CME.,  Today at 4 months repeat injection today and will probably watch and observe hereafter

## 2021-10-12 NOTE — Assessment & Plan Note (Signed)
OS, no sign of CNVM °

## 2021-10-12 NOTE — Assessment & Plan Note (Signed)
Subfoveal atrophy limits acuity OD

## 2021-10-12 NOTE — Progress Notes (Signed)
10/12/2021     CHIEF COMPLAINT Patient presents for  Chief Complaint  Patient presents with   Retina Follow Up      HISTORY OF PRESENT ILLNESS: Alexandria Morris is a 85 y.o. female who presents to the clinic today for:   HPI     Retina Follow Up   Patient presents with  Wet AMD.  In right eye.  This started 4 months ago.  Severity is mild.  Duration of 4 months.  Since onset it is stable.        Comments   4 month fu ou and Avastin and OCT OD (pt missed appointment) Pt states VA OU stable since last visit. Pt denies FOL, floaters, or ocular pain OU.        Last edited by Demetrios Loll, COA on 10/12/2021  2:28 PM.      Referring physician: Elsie Amis, MD [address] [place],  [place] [address]  HISTORICAL INFORMATION:   Selected notes from the MEDICAL RECORD NUMBER       CURRENT MEDICATIONS: No current outpatient medications on file. (Ophthalmic Drugs)   No current facility-administered medications for this visit. (Ophthalmic Drugs)   Current Outpatient Medications (Other)  Medication Sig   acetaminophen (TYLENOL) 500 MG tablet Take 500 mg by mouth every 4 (four) hours as needed.   albuterol (PROAIR HFA) 108 (90 BASE) MCG/ACT inhaler Inhale 2 puffs into the lungs every 4 (four) hours as needed for wheezing or shortness of breath.   Cholecalciferol (VITAMIN D) 400 UNITS capsule Take 1 capsule by mouth daily.   clonazePAM (KLONOPIN) 0.5 MG tablet Take 0.5-1 mg by mouth 3 (three) times daily as needed.    diltiazem (CARDIZEM CD) 240 MG 24 hr capsule Take 240 mg by mouth daily.   fish oil-omega-3 fatty acids 1000 MG capsule Take 1 g by mouth daily.   Multiple Vitamins-Minerals (PRESERVISION AREDS 2) CAPS Take 1 capsule by mouth 2 (two) times daily.   olmesartan (BENICAR) 20 MG tablet Take 20 mg by mouth daily.   omeprazole (PRILOSEC) 40 MG capsule Take 20 mg by mouth daily.    QUEtiapine (SEROQUEL) 25 MG tablet Take 25 mg by  mouth at bedtime.    sodium chloride (OCEAN) 0.65 % SOLN nasal spray Place 1 spray into both nostrils as needed for congestion.   SYMBICORT 80-4.5 MCG/ACT inhaler INHALE 2 PUFFS INTO THE LUNGS 2 (TWO) TIMES DAILY.   SYMBICORT 80-4.5 MCG/ACT inhaler INHALE 2 PUFFS INTO THE LUNGS 2 (TWO) TIMES DAILY.   Triamcinolone Acetonide (NASACORT ALLERGY 24HR NA) Place into the nose.   venlafaxine XR (EFFEXOR XR) 37.5 MG 24 hr capsule Take 1 capsule by mouth daily.   warfarin (COUMADIN) 5 MG tablet Take 1 tablet by mouth as directed.   No current facility-administered medications for this visit. (Other)      REVIEW OF SYSTEMS:    ALLERGIES Allergies  Allergen Reactions   Metoprolol Tartrate Itching    And nightmares   Albuterol     hyper   Bentyl [Dicyclomine Hcl]     Does not remember   Brimonidine    Cetirizine     Other reaction(s): DRYNESS   Claritin [Loratadine]     Could not breathe good   Fexofenadine Other (See Comments)    Bad after taste   Haldol Decanoate [Haloperidol Decanoate]     Felt weird   Lovaza [Omega-3-Acid Ethyl Esters]     Fish taste  Pravastatin    Simvastatin    Triavil [Perphenazine-Amitriptyline]     weird    PAST MEDICAL HISTORY Past Medical History:  Diagnosis Date   Acute bronchitis    Acute sinusitis    Asthma    Ataxia    Cellulitis    Chest pain    COPD (chronic obstructive pulmonary disease) (HCC)    Depression    Dermatitis    HTN (hypertension)    Hypercholesteremia    Hyperglycemia    Hypertension    IBS (irritable bowel syndrome)    Incontinence of urine    Insomnia    Microalbuminuria    OCD (obsessive compulsive disorder)    Osteoarthritis    Osteopenia    Osteoporosis    Reflux    Past Surgical History:  Procedure Laterality Date   BLADDER SURGERY     CATARACT EXTRACTION W/PHACO Bilateral    HERNIA REPAIR      FAMILY HISTORY Family History  Problem Relation Age of Onset   Heart disease Other    Breast cancer  Other    Coronary artery disease Other    Depression Other    Diabetes Other    Hyperlipidemia Other    Hypertension Other    Osteoporosis Other    Kidney disease Other    Lung disease Other    Stroke Other     SOCIAL HISTORY Social History   Tobacco Use   Smoking status: Never   Smokeless tobacco: Never  Substance Use Topics   Alcohol use: No   Drug use: No         OPHTHALMIC EXAM:  Base Eye Exam     Visual Acuity (ETDRS)       Right Left   Dist cc CF at 3' 20/25    Correction: Glasses         Tonometry (Tonopen, 2:31 PM)       Right Left   Pressure 14 16         Pupils       Pupils Dark Light Shape React APD   Right PERRL 2 2 Round Minimal None   Left PERRL 2 2 Round Minimal None         Visual Fields (Counting fingers)       Left Right    Full Full         Extraocular Movement       Right Left    Full Full         Neuro/Psych     Oriented x3: Yes   Mood/Affect: Normal         Dilation     Both eyes: 1.0% Mydriacyl, 2.5% Phenylephrine @ 2:31 PM           Slit Lamp and Fundus Exam     External Exam       Right Left   External Normal Normal         Slit Lamp Exam       Right Left   Lids/Lashes Normal Normal   Conjunctiva/Sclera White and quiet White and quiet   Cornea Clear Clear   Anterior Chamber Deep and quiet, no cells Deep and quiet   Iris Round and reactive Round and reactive   Lens Posterior chamber intraocular lens Posterior chamber intraocular lens   Anterior Vitreous Normal Normal         Fundus Exam       Right Left   Posterior Vitreous ,, Central  vitreous floaters, Posterior vitreous detachment Posterior vitreous detachment   Disc Normal Normal   C/D Ratio 0.5 0.5   Macula Geographic atrophy in the FAZ, Hemorrhage, inferiorly, Macular thickening probably, Disciform scar on the temporal edge of the atrophy, appears to be RCA (retinal choroidal anastomoses), Macular thickening  Intermediate age related macular degeneration, Pigmented atrophy, Soft drusen, no macular thickening, no hemorrhage   Vessels Normal Normal   Periphery Normal Normal            IMAGING AND PROCEDURES  Imaging and Procedures for 10/12/21  OCT, Retina - OU - Both Eyes       Right Eye Quality was good. Scan locations included subfoveal. Central Foveal Thickness: 300. Progression has worsened. Findings include abnormal foveal contour, cystoid macular edema.   Left Eye Quality was good. Scan locations included subfoveal. Central Foveal Thickness: 274. Progression has been stable. Findings include no IRF, no SRF, retinal drusen , abnormal foveal contour.   Notes OD with slightly increased intraretinal fluid and scarring temporally now 4 months post Avastin follow-up as scheduled right eye, still with active intraretinal fluid  Left eye with no signs of active CNVM     Intravitreal Injection, Pharmacologic Agent - OD - Right Eye       Time Out 10/12/2021. 3:09 PM. Confirmed correct patient, procedure, site, and patient consented.   Anesthesia Topical anesthesia was used. Anesthetic medications included Akten 3.5%.   Procedure Preparation included Ofloxacin , 10% betadine to eyelids, 5% betadine to ocular surface. A 30 gauge needle was used.   Injection: 2.5 mg bevacizumab 2.5 MG/0.1ML   Route: Intravitreal, Site: Right Eye   NDC: [id number], Lot: [id number]   Post-op Post injection exam found visual acuity of at least counting fingers. The patient tolerated the procedure well. There were no complications. The patient received written and verbal post procedure care education. Post injection medications were not given.              ASSESSMENT/PLAN:  Exudative age-related macular degeneration of right eye with active choroidal neovascularization (HCC) Chronic subfoveal active CNVM with perifoveal CME.,  Today at 4 months repeat injection today and will probably watch  and observe hereafter  Advanced nonexudative age-related macular degeneration of right eye with subfoveal involvement Subfoveal atrophy limits acuity OD  Intermediate stage nonexudative age-related macular degeneration of left eye OS, no sign of CNVM     ICD-10-CM   1. Exudative age-related macular degeneration of right eye with active choroidal neovascularization (HCC)  H35.3211 OCT, Retina - OU - Both Eyes    Intravitreal Injection, Pharmacologic Agent - OD - Right Eye    bevacizumab (AVASTIN) SOSY 2.5 mg    2. Advanced nonexudative age-related macular degeneration of right eye with subfoveal involvement  H35.3114     3. Intermediate stage nonexudative age-related macular degeneration of left eye  H35.3122       1.  Repeat injection intravitreal Avastin today at 4 months as patient missed last appointment some 2 and half months previous due to other illness.  We will repeat injection with the goal of maintaining acuity preventing scotoma enlarged  2.  OS, no sign of wet AMD will observe  3.  Ophthalmic Meds Ordered this visit:  Meds ordered this encounter  Medications   bevacizumab (AVASTIN) SOSY 2.5 mg       Return in about 5 months (around 03/12/2022) for DILATE OU, AVASTIN OCT, OD.  There are no Patient Instructions on file for this visit.  Explained the diagnoses, plan, and follow up with the patient and they expressed understanding.  Patient expressed understanding of the importance of proper follow up care.   Alford Highland Vane Yapp M.D. Diseases & Surgery of the Retina and Vitreous [facility] 10/12/21     Abbreviations: M myopia (nearsighted); A astigmatism; H hyperopia (farsighted); P presbyopia; Mrx spectacle prescription;  CTL contact lenses; OD right eye; OS left eye; OU both eyes  XT exotropia; ET esotropia; PEK punctate epithelial keratitis; PEE punctate epithelial erosions; DES dry eye syndrome; MGD meibomian gland dysfunction; ATs artificial  tears; PFAT's preservative free artificial tears; NSC nuclear sclerotic cataract; PSC posterior subcapsular cataract; ERM epi-retinal membrane; PVD posterior vitreous detachment; RD retinal detachment; DM diabetes mellitus; DR diabetic retinopathy; NPDR non-proliferative diabetic retinopathy; PDR proliferative diabetic retinopathy; CSME clinically significant macular edema; DME diabetic macular edema; dbh dot blot hemorrhages; CWS cotton wool spot; POAG primary open angle glaucoma; C/D cup-to-disc ratio; HVF humphrey visual field; GVF goldmann visual field; OCT optical coherence tomography; IOP intraocular pressure; BRVO Branch retinal vein occlusion; CRVO central retinal vein occlusion; CRAO central retinal artery occlusion; BRAO branch retinal artery occlusion; RT retinal tear; SB scleral buckle; PPV pars plana vitrectomy; VH Vitreous hemorrhage; PRP panretinal laser photocoagulation; IVK intravitreal kenalog; VMT vitreomacular traction; MH Macular hole;  NVD neovascularization of the disc; NVE neovascularization elsewhere; AREDS age related eye disease study; ARMD age related macular degeneration; POAG primary open angle glaucoma; EBMD epithelial/anterior basement membrane dystrophy; ACIOL anterior chamber intraocular lens; IOL intraocular lens; PCIOL posterior chamber intraocular lens; Phaco/IOL phacoemulsification with intraocular lens placement; PRK photorefractive keratectomy; LASIK laser assisted in situ keratomileusis; HTN hypertension; DM diabetes mellitus; COPD chronic obstructive pulmonary disease

## 2022-03-17 ENCOUNTER — Encounter (INDEPENDENT_AMBULATORY_CARE_PROVIDER_SITE_OTHER): Payer: Medicare Other | Admitting: Ophthalmology

## 2022-05-03 ENCOUNTER — Encounter (INDEPENDENT_AMBULATORY_CARE_PROVIDER_SITE_OTHER): Payer: Medicare Other | Admitting: Ophthalmology

## 2022-05-10 ENCOUNTER — Encounter (INDEPENDENT_AMBULATORY_CARE_PROVIDER_SITE_OTHER): Payer: Medicare Other | Admitting: Ophthalmology

## 2022-06-14 ENCOUNTER — Encounter (INDEPENDENT_AMBULATORY_CARE_PROVIDER_SITE_OTHER): Payer: Medicare Other | Admitting: Ophthalmology

## 2022-06-28 ENCOUNTER — Encounter (INDEPENDENT_AMBULATORY_CARE_PROVIDER_SITE_OTHER): Payer: Medicare Other | Admitting: Ophthalmology

## 2022-08-30 ENCOUNTER — Encounter (INDEPENDENT_AMBULATORY_CARE_PROVIDER_SITE_OTHER): Payer: Self-pay | Admitting: Ophthalmology

## 2022-08-30 ENCOUNTER — Ambulatory Visit (INDEPENDENT_AMBULATORY_CARE_PROVIDER_SITE_OTHER): Payer: Medicare Other | Admitting: Ophthalmology

## 2022-08-30 DIAGNOSIS — H353122 Nonexudative age-related macular degeneration, left eye, intermediate dry stage: Secondary | ICD-10-CM | POA: Diagnosis not present

## 2022-08-30 DIAGNOSIS — H353114 Nonexudative age-related macular degeneration, right eye, advanced atrophic with subfoveal involvement: Secondary | ICD-10-CM

## 2022-08-30 DIAGNOSIS — H353211 Exudative age-related macular degeneration, right eye, with active choroidal neovascularization: Secondary | ICD-10-CM | POA: Diagnosis not present

## 2022-08-30 DIAGNOSIS — H353212 Exudative age-related macular degeneration, right eye, with inactive choroidal neovascularization: Secondary | ICD-10-CM | POA: Diagnosis not present

## 2022-08-30 NOTE — Assessment & Plan Note (Signed)
No sign of CNVM left eye 

## 2022-08-30 NOTE — Progress Notes (Signed)
08/30/2022     CHIEF COMPLAINT Patient presents for  Chief Complaint  Patient presents with   Macular Degeneration      HISTORY OF PRESENT ILLNESS: Alexandria Morris is a 86 y.o. female who presents to the clinic today for:   HPI   10 MOS FU OU AVASTIN OD, Last seen 09/2021 Pt stated vision has remained stable since last visit. Pt has allergy to Brimonidine. Last edited by Angeline Slim on 08/30/2022  2:41 PM.      Referring physician: No referring provider defined for this encounter.  HISTORICAL INFORMATION:   Selected notes from the MEDICAL RECORD NUMBER       CURRENT MEDICATIONS: No current outpatient medications on file. (Ophthalmic Drugs)   No current facility-administered medications for this visit. (Ophthalmic Drugs)   Current Outpatient Medications (Other)  Medication Sig   acetaminophen (TYLENOL) 500 MG tablet Take 500 mg by mouth every 4 (four) hours as needed.   albuterol (PROAIR HFA) 108 (90 BASE) MCG/ACT inhaler Inhale 2 puffs into the lungs every 4 (four) hours as needed for wheezing or shortness of breath.   Cholecalciferol (VITAMIN D) 400 UNITS capsule Take 1 capsule by mouth daily.   clonazePAM (KLONOPIN) 0.5 MG tablet Take 0.5-1 mg by mouth 3 (three) times daily as needed.    diltiazem (CARDIZEM CD) 240 MG 24 hr capsule Take 240 mg by mouth daily.   fish oil-omega-3 fatty acids 1000 MG capsule Take 1 g by mouth daily.   Multiple Vitamins-Minerals (PRESERVISION AREDS 2) CAPS Take 1 capsule by mouth 2 (two) times daily.   olmesartan (BENICAR) 20 MG tablet Take 20 mg by mouth daily.   omeprazole (PRILOSEC) 40 MG capsule Take 20 mg by mouth daily.    QUEtiapine (SEROQUEL) 25 MG tablet Take 25 mg by mouth at bedtime.    sodium chloride (OCEAN) 0.65 % SOLN nasal spray Place 1 spray into both nostrils as needed for congestion.   SYMBICORT 80-4.5 MCG/ACT inhaler INHALE 2 PUFFS INTO THE LUNGS 2 (TWO) TIMES DAILY.   SYMBICORT 80-4.5 MCG/ACT inhaler INHALE 2 PUFFS  INTO THE LUNGS 2 (TWO) TIMES DAILY.   Triamcinolone Acetonide (NASACORT ALLERGY 24HR NA) Place into the nose.   venlafaxine XR (EFFEXOR XR) 37.5 MG 24 hr capsule Take 1 capsule by mouth daily.   warfarin (COUMADIN) 5 MG tablet Take 1 tablet by mouth as directed.   No current facility-administered medications for this visit. (Other)      REVIEW OF SYSTEMS: ROS   Negative for: Constitutional, Gastrointestinal, Neurological, Skin, Genitourinary, Musculoskeletal, HENT, Endocrine, Cardiovascular, Eyes, Respiratory, Psychiatric, Allergic/Imm, Heme/Lymph Last edited by Angeline Slim on 08/30/2022  2:41 PM.       ALLERGIES Allergies  Allergen Reactions   Metoprolol Tartrate Itching    And nightmares   Albuterol     hyper   Bentyl [Dicyclomine Hcl]     Does not remember   Brimonidine    Cetirizine     Other reaction(s): DRYNESS   Claritin [Loratadine]     Could not breathe good   Fexofenadine Other (See Comments)    Bad after taste   Haldol Decanoate [Haloperidol Decanoate]     Felt weird   Lovaza [Omega-3-Acid Ethyl Esters]     Fish taste   Pravastatin    Simvastatin    Triavil [Perphenazine-Amitriptyline]     weird    PAST MEDICAL HISTORY Past Medical History:  Diagnosis Date   Acute bronchitis    Acute sinusitis  Asthma    Ataxia    Cellulitis    Chest pain    COPD (chronic obstructive pulmonary disease) (HCC)    Depression    Dermatitis    HTN (hypertension)    Hypercholesteremia    Hyperglycemia    Hypertension    IBS (irritable bowel syndrome)    Incontinence of urine    Insomnia    Microalbuminuria    OCD (obsessive compulsive disorder)    Osteoarthritis    Osteopenia    Osteoporosis    Reflux    Past Surgical History:  Procedure Laterality Date   BLADDER SURGERY     CATARACT EXTRACTION W/PHACO Bilateral    HERNIA REPAIR      FAMILY HISTORY Family History  Problem Relation Age of Onset   Heart disease Other    Breast cancer Other    Coronary  artery disease Other    Depression Other    Diabetes Other    Hyperlipidemia Other    Hypertension Other    Osteoporosis Other    Kidney disease Other    Lung disease Other    Stroke Other     SOCIAL HISTORY Social History   Tobacco Use   Smoking status: Never   Smokeless tobacco: Never  Substance Use Topics   Alcohol use: No   Drug use: No         OPHTHALMIC EXAM:  Base Eye Exam     Visual Acuity (ETDRS)       Right Left   Dist cc CF at 2' 20/30 -1   Dist ph cc  20/25 -2    Correction: Glasses         Tonometry (Tonopen, 2:46 PM)       Right Left   Pressure 19 20         Pupils       Pupils APD   Right PERRL None   Left PERRL None         Visual Fields       Left Right    Full Full         Extraocular Movement       Right Left    Full Full         Neuro/Psych     Oriented x3: Yes   Mood/Affect: Normal         Dilation     Both eyes: 1.0% Mydriacyl, 2.5% Phenylephrine @ 2:46 PM           Slit Lamp and Fundus Exam     External Exam       Right Left   External Normal Normal         Slit Lamp Exam       Right Left   Lids/Lashes Normal Normal   Conjunctiva/Sclera White and quiet White and quiet   Cornea Clear Clear   Anterior Chamber Deep and quiet, no cells Deep and quiet   Iris Round and reactive Round and reactive   Lens Posterior chamber intraocular lens Posterior chamber intraocular lens   Anterior Vitreous Normal Normal         Fundus Exam       Right Left   Posterior Vitreous ,, Central vitreous floaters, Posterior vitreous detachment Posterior vitreous detachment   Disc Normal Normal   C/D Ratio 0.5 0.5   Macula Geographic atrophy in the FAZ, Hemorrhage, inferiorly, Macular thickening probably, Disciform scar on the temporal edge of the atrophy, appears to be  RCA (retinal choroidal anastomoses), Macular thickening Intermediate age related macular degeneration, Pigmented atrophy, Soft drusen, no  macular thickening, no hemorrhage   Vessels Normal Normal   Periphery Normal Normal            IMAGING AND PROCEDURES  Imaging and Procedures for 08/30/22  OCT, Retina - OU - Both Eyes       Right Eye Quality was good. Scan locations included subfoveal. Central Foveal Thickness: 323. Progression has worsened. Findings include abnormal foveal contour, cystoid macular edema.   Left Eye Quality was good. Scan locations included subfoveal. Central Foveal Thickness: 277. Progression has been stable. Findings include no IRF, no SRF, abnormal foveal contour, retinal drusen .   Notes OD with slightly increased intraretinal fluid and scarring temporally now 4 months post Avastin follow-up as scheduled right eye, still with active intraretinal fluid  Left eye with no signs of active CNVM      Intravitreal Injection, Pharmacologic Agent - OD - Right Eye                   ASSESSMENT/PLAN:  Advanced nonexudative age-related macular degeneration of right eye with subfoveal involvement Longstanding lesion.  Lost to follow-up.  Patient had other medical issues in play.  Disciform scar too large to treat.  Observe  Exudative age-related macular degeneration of right eye with inactive choroidal neovascularization (HCC) No longer treatable will observe  Intermediate stage nonexudative age-related macular degeneration of left eye No sign of CNVM left eye     ICD-10-CM   1. Exudative age-related macular degeneration of right eye with active choroidal neovascularization (HCC)  H35.3211 OCT, Retina - OU - Both Eyes    Intravitreal Injection, Pharmacologic Agent - OD - Right Eye    2. Advanced nonexudative age-related macular degeneration of right eye with subfoveal involvement  H35.3114     3. Exudative age-related macular degeneration of right eye with inactive choroidal neovascularization (HCC)  H35.3212     4. Intermediate stage nonexudative age-related macular degeneration of  left eye  H35.3122       1.  OD, with large disciform scar now too large to treat.  No active hemorrhage noted clinically.  Will observe  Technician initiated a injection of pharmacological agent screening that can no longer be canceled according to the epic when the physician provider strikes the proper button to cancel the order. Specifically no injection was delivered to the right eye today  2.  OS, intermittent AMD no sign of CNVM  3.  NO INJECTION OF ANY KIND GIVEN OR PERFORMED ON PATIENT TODAY  Ophthalmic Meds Ordered this visit:  No orders of the defined types were placed in this encounter.      Return in about 9 months (around 05/31/2023) for DILATE OU, COLOR FP, OCT.  There are no Patient Instructions on file for this visit.   Explained the diagnoses, plan, and follow up with the patient and they expressed understanding.  Patient expressed understanding of the importance of proper follow up care.   Alford Highland Celsey Asselin M.D. Diseases & Surgery of the Retina and Vitreous [facility] 08/30/22     Abbreviations: M myopia (nearsighted); A astigmatism; H hyperopia (farsighted); P presbyopia; Mrx spectacle prescription;  CTL contact lenses; OD right eye; OS left eye; OU both eyes  XT exotropia; ET esotropia; PEK punctate epithelial keratitis; PEE punctate epithelial erosions; DES dry eye syndrome; MGD meibomian gland dysfunction; ATs artificial tears; PFAT's preservative free artificial tears; NSC  nuclear sclerotic cataract; PSC posterior subcapsular cataract; ERM epi-retinal membrane; PVD posterior vitreous detachment; RD retinal detachment; DM diabetes mellitus; DR diabetic retinopathy; NPDR non-proliferative diabetic retinopathy; PDR proliferative diabetic retinopathy; CSME clinically significant macular edema; DME diabetic macular edema; dbh dot blot hemorrhages; CWS cotton wool spot; POAG primary open angle glaucoma; C/D cup-to-disc ratio; HVF humphrey visual  field; GVF goldmann visual field; OCT optical coherence tomography; IOP intraocular pressure; BRVO Branch retinal vein occlusion; CRVO central retinal vein occlusion; CRAO central retinal artery occlusion; BRAO branch retinal artery occlusion; RT retinal tear; SB scleral buckle; PPV pars plana vitrectomy; VH Vitreous hemorrhage; PRP panretinal laser photocoagulation; IVK intravitreal kenalog; VMT vitreomacular traction; MH Macular hole;  NVD neovascularization of the disc; NVE neovascularization elsewhere; AREDS age related eye disease study; ARMD age related macular degeneration; POAG primary open angle glaucoma; EBMD epithelial/anterior basement membrane dystrophy; ACIOL anterior chamber intraocular lens; IOL intraocular lens; PCIOL posterior chamber intraocular lens; Phaco/IOL phacoemulsification with intraocular lens placement; PRK photorefractive keratectomy; LASIK laser assisted in situ keratomileusis; HTN hypertension; DM diabetes mellitus; COPD chronic obstructive pulmonary disease

## 2022-08-30 NOTE — Assessment & Plan Note (Signed)
Longstanding lesion.  Lost to follow-up.  Patient had other medical issues in play.  Disciform scar too large to treat.  Observe

## 2022-08-30 NOTE — Assessment & Plan Note (Signed)
No longer treatable will observe

## 2022-09-21 MED ORDER — TRIAMCINOLONE ACETONIDE 40 MG/ML IO SUSP
1.0000 mg | INTRAOCULAR | Status: AC | PRN
  Administered 2022-09-21: 1 mg via INTRAVITREAL

## 2023-04-19 DEATH — deceased

## 2023-06-06 ENCOUNTER — Encounter (INDEPENDENT_AMBULATORY_CARE_PROVIDER_SITE_OTHER): Payer: Medicare Other | Admitting: Ophthalmology
